# Patient Record
Sex: Male | Born: 1960 | Race: White | Hispanic: No | Marital: Single | Smoking: Current some day smoker
Health system: Southern US, Community
[De-identification: ages and names within clinical notes are randomized; demographics above are authoritative.]

## PROBLEM LIST (undated history)

## (undated) DIAGNOSIS — K5720 Diverticulitis of large intestine with perforation and abscess without bleeding: Principal | ICD-10-CM

## (undated) DIAGNOSIS — Z972 Presence of dental prosthetic device (complete) (partial): Secondary | ICD-10-CM

## (undated) DIAGNOSIS — M5412 Radiculopathy, cervical region: Secondary | ICD-10-CM

## (undated) HISTORY — DX: Diverticulitis of large intestine with perforation and abscess without bleeding: K57.20

## (undated) HISTORY — DX: Radiculopathy, cervical region: M54.12

---

## 1985-12-08 HISTORY — PX: HAND SURGERY: SHX662

## 2014-10-20 DIAGNOSIS — M5412 Radiculopathy, cervical region: Secondary | ICD-10-CM

## 2014-10-20 HISTORY — DX: Radiculopathy, cervical region: M54.12

## 2016-05-20 ENCOUNTER — Inpatient Hospital Stay
Admission: EM | Admit: 2016-05-20 | Discharge: 2016-06-01 | DRG: 330 | Disposition: A | Discharge status: Home Health | Payer: BLUE CROSS/BLUE SHIELD | Attending: Surgery | Admitting: Surgery

## 2016-05-20 ENCOUNTER — Encounter: Payer: Self-pay | Admitting: Emergency Medicine

## 2016-05-20 ENCOUNTER — Emergency Department: Payer: BLUE CROSS/BLUE SHIELD

## 2016-05-20 DIAGNOSIS — K567 Ileus, unspecified: Secondary | ICD-10-CM | POA: Diagnosis not present

## 2016-05-20 DIAGNOSIS — K5720 Diverticulitis of large intestine with perforation and abscess without bleeding: Secondary | ICD-10-CM

## 2016-05-20 DIAGNOSIS — Y92230 Patient room in hospital as the place of occurrence of the external cause: Secondary | ICD-10-CM | POA: Diagnosis not present

## 2016-05-20 DIAGNOSIS — K631 Perforation of intestine (nontraumatic): Secondary | ICD-10-CM

## 2016-05-20 DIAGNOSIS — Y838 Other surgical procedures as the cause of abnormal reaction of the patient, or of later complication, without mention of misadventure at the time of the procedure: Secondary | ICD-10-CM | POA: Diagnosis not present

## 2016-05-20 DIAGNOSIS — T8132XA Disruption of internal operation (surgical) wound, not elsewhere classified, initial encounter: Secondary | ICD-10-CM | POA: Diagnosis not present

## 2016-05-20 DIAGNOSIS — K660 Peritoneal adhesions (postprocedural) (postinfection): Secondary | ICD-10-CM | POA: Diagnosis present

## 2016-05-20 DIAGNOSIS — F17210 Nicotine dependence, cigarettes, uncomplicated: Secondary | ICD-10-CM | POA: Diagnosis present

## 2016-05-20 DIAGNOSIS — R52 Pain, unspecified: Secondary | ICD-10-CM

## 2016-05-20 DIAGNOSIS — R0602 Shortness of breath: Secondary | ICD-10-CM

## 2016-05-20 DIAGNOSIS — Z4659 Encounter for fitting and adjustment of other gastrointestinal appliance and device: Secondary | ICD-10-CM

## 2016-05-20 HISTORY — DX: Diverticulitis of large intestine with perforation and abscess without bleeding: K57.20

## 2016-05-20 LAB — CBC WITH DIFFERENTIAL/PLATELET
BASOS ABS: 0.1 10*3/uL (ref 0–0.1)
Eosinophils Absolute: 0 10*3/uL (ref 0–0.7)
Eosinophils Relative: 0 %
HEMATOCRIT: 45.1 % (ref 40.0–52.0)
HEMOGLOBIN: 15.8 g/dL (ref 13.0–18.0)
Lymphocytes Relative: 16 %
Lymphs Abs: 2 10*3/uL (ref 1.0–3.6)
MCH: 36.2 pg — ABNORMAL HIGH (ref 26.0–34.0)
MCHC: 35.2 g/dL (ref 32.0–36.0)
MCV: 102.9 fL — ABNORMAL HIGH (ref 80.0–100.0)
MONO ABS: 0.8 10*3/uL (ref 0.2–1.0)
Monocytes Relative: 6 %
NEUTROS ABS: 9.9 10*3/uL — AB (ref 1.4–6.5)
Platelets: 188 10*3/uL (ref 150–440)
RBC: 4.38 MIL/uL — ABNORMAL LOW (ref 4.40–5.90)
RDW: 14.3 % (ref 11.5–14.5)
WBC: 12.9 10*3/uL — ABNORMAL HIGH (ref 3.8–10.6)

## 2016-05-20 LAB — COMPREHENSIVE METABOLIC PANEL
ALBUMIN: 3.8 g/dL (ref 3.5–5.0)
ALT: 12 U/L — ABNORMAL LOW (ref 17–63)
AST: 18 U/L (ref 15–41)
Alkaline Phosphatase: 62 U/L (ref 38–126)
Anion gap: 11 (ref 5–15)
BILIRUBIN TOTAL: 2.2 mg/dL — AB (ref 0.3–1.2)
BUN: 11 mg/dL (ref 6–20)
CO2: 24 mmol/L (ref 22–32)
Calcium: 9.1 mg/dL (ref 8.9–10.3)
Chloride: 98 mmol/L — ABNORMAL LOW (ref 101–111)
Creatinine, Ser: 1.01 mg/dL (ref 0.61–1.24)
GFR calc Af Amer: >60 mL/min (ref >60)
GFR calc non Af Amer: >60 mL/min (ref >60)
GLUCOSE: 130 mg/dL — AB (ref 65–99)
POTASSIUM: 3.6 mmol/L (ref 3.5–5.1)
SODIUM: 133 mmol/L — AB (ref 135–145)
TOTAL PROTEIN: 7.5 g/dL (ref 6.5–8.1)

## 2016-05-20 LAB — URINALYSIS COMPLETE WITH MICROSCOPIC (ARMC ONLY)
BACTERIA UA: NONE SEEN
Bilirubin Urine: NEGATIVE
Glucose, UA: NEGATIVE mg/dL
HGB URINE DIPSTICK: NEGATIVE
LEUKOCYTES UA: NEGATIVE
Nitrite: NEGATIVE
PH: 5 (ref 5.0–8.0)
PROTEIN: NEGATIVE mg/dL
SPECIFIC GRAVITY, URINE: 1.043 — AB (ref 1.005–1.030)
SQUAMOUS EPITHELIAL / LPF: NONE SEEN

## 2016-05-20 LAB — TROPONIN I: Troponin I: <0.03 ng/mL (ref <0.031)

## 2016-05-20 LAB — LIPASE, BLOOD: Lipase: 16 U/L (ref 11–51)

## 2016-05-20 MED ORDER — HYDROMORPHONE HCL 1 MG/ML IJ SOLN: 0.5000 mg | INTRAMUSCULAR | Status: DC | PRN

## 2016-05-20 MED ORDER — LACTATED RINGERS IV BOLUS (SEPSIS)
1000.0000 mL | Freq: Once | INTRAVENOUS | Status: AC
  Administered 2016-05-20: 1000 mL via INTRAVENOUS
  Filled 2016-05-20: qty 1000

## 2016-05-20 MED ORDER — PIPERACILLIN-TAZOBACTAM 3.375 G IVPB 30 MIN
3.3750 g | Freq: Once | INTRAVENOUS | Status: AC
  Administered 2016-05-20: 3.375 g via INTRAVENOUS
  Filled 2016-05-20: qty 50

## 2016-05-20 MED ORDER — DIPHENHYDRAMINE HCL 12.5 MG/5ML PO ELIX: 12.5000 mg | ORAL_SOLUTION | Freq: Four times a day (QID) | ORAL | Status: DC | PRN

## 2016-05-20 MED ORDER — ENOXAPARIN SODIUM 40 MG/0.4ML ~~LOC~~ SOLN
40.0000 mg | SUBCUTANEOUS | Status: DC
  Administered 2016-05-21 – 2016-06-01 (×11): 40 mg via SUBCUTANEOUS
  Filled 2016-05-20 (×11): qty 0.4

## 2016-05-20 MED ORDER — ONDANSETRON 8 MG PO TBDP: 4.0000 mg | ORAL_TABLET | Freq: Four times a day (QID) | ORAL | Status: DC | PRN

## 2016-05-20 MED ORDER — MORPHINE SULFATE (PF) 4 MG/ML IV SOLN
4.0000 mg | Freq: Once | INTRAVENOUS | Status: AC
  Administered 2016-05-20: 4 mg via INTRAVENOUS

## 2016-05-20 MED ORDER — SODIUM CHLORIDE 0.9 % IV SOLN
1000.0000 mL | Freq: Once | INTRAVENOUS | Status: AC
  Administered 2016-05-20: 1000 mL via INTRAVENOUS

## 2016-05-20 MED ORDER — DIPHENHYDRAMINE HCL 50 MG/ML IJ SOLN
12.5000 mg | Freq: Four times a day (QID) | INTRAMUSCULAR | Status: DC | PRN
  Administered 2016-05-20: 12.5 mg via INTRAVENOUS
  Filled 2016-05-20: qty 1

## 2016-05-20 MED ORDER — MORPHINE SULFATE (PF) 4 MG/ML IV SOLN
INTRAVENOUS | Status: AC
  Administered 2016-05-20: 4 mg via INTRAVENOUS
  Filled 2016-05-20: qty 1

## 2016-05-20 MED ORDER — DIATRIZOATE MEGLUMINE & SODIUM 66-10 % PO SOLN
15.0000 mL | Freq: Once | ORAL | Status: AC
  Administered 2016-05-20: 15 mL via ORAL

## 2016-05-20 MED ORDER — NICOTINE 14 MG/24HR TD PT24
14.0000 mg | MEDICATED_PATCH | Freq: Every day | TRANSDERMAL | Status: DC
  Administered 2016-05-20 – 2016-06-01 (×13): 14 mg via TRANSDERMAL
  Filled 2016-05-20 (×13): qty 1

## 2016-05-20 MED ORDER — ONDANSETRON HCL 4 MG/2ML IJ SOLN
4.0000 mg | Freq: Once | INTRAMUSCULAR | Status: AC
  Administered 2016-05-20: 4 mg via INTRAVENOUS
  Filled 2016-05-20: qty 2

## 2016-05-20 MED ORDER — PIPERACILLIN-TAZOBACTAM 3.375 G IVPB
3.3750 g | Freq: Three times a day (TID) | INTRAVENOUS | Status: DC
  Administered 2016-05-20 – 2016-06-01 (×35): 3.375 g via INTRAVENOUS
  Filled 2016-05-20 (×38): qty 50

## 2016-05-20 MED ORDER — KETOROLAC TROMETHAMINE 30 MG/ML IJ SOLN
30.0000 mg | Freq: Four times a day (QID) | INTRAMUSCULAR | Status: DC | PRN
  Administered 2016-05-20 – 2016-05-21 (×4): 30 mg via INTRAVENOUS
  Filled 2016-05-20 (×5): qty 1

## 2016-05-20 MED ORDER — LACTATED RINGERS IV BOLUS (SEPSIS)
1000.0000 mL | Freq: Once | INTRAVENOUS | Status: AC
  Administered 2016-05-20: 1000 mL via INTRAVENOUS

## 2016-05-20 MED ORDER — IOPAMIDOL (ISOVUE-300) INJECTION 61%
100.0000 mL | Freq: Once | INTRAVENOUS | Status: AC | PRN
  Administered 2016-05-20: 100 mL via INTRAVENOUS

## 2016-05-20 MED ORDER — DEXTROSE-NACL 5-0.9 % IV SOLN
INTRAVENOUS | Status: DC
  Administered 2016-05-20 – 2016-05-22 (×6): via INTRAVENOUS
  Administered 2016-05-22: 1000 mL via INTRAVENOUS
  Administered 2016-05-23 – 2016-05-26 (×10): via INTRAVENOUS
  Administered 2016-05-27: 1000 mL via INTRAVENOUS
  Administered 2016-05-28 – 2016-05-31 (×4): via INTRAVENOUS

## 2016-05-20 MED ORDER — OXYCODONE-ACETAMINOPHEN 5-325 MG PO TABS
1.0000 | ORAL_TABLET | ORAL | Status: DC | PRN
  Administered 2016-05-20 – 2016-05-22 (×5): 2 via ORAL
  Filled 2016-05-20 (×5): qty 2

## 2016-05-20 MED ORDER — MORPHINE SULFATE (PF) 4 MG/ML IV SOLN
4.0000 mg | Freq: Once | INTRAVENOUS | Status: AC
  Administered 2016-05-20: 4 mg via INTRAVENOUS
  Filled 2016-05-20: qty 1

## 2016-05-20 MED ORDER — ONDANSETRON HCL 4 MG/2ML IJ SOLN
4.0000 mg | Freq: Four times a day (QID) | INTRAMUSCULAR | Status: DC | PRN
  Administered 2016-05-20 – 2016-05-24 (×3): 4 mg via INTRAVENOUS
  Filled 2016-05-20 (×4): qty 2

## 2016-05-20 NOTE — ED Notes (Signed)
Approximately 5 minutes after morphine administration, pt's HR dropped to 43 and this nurse noticed that pt was looking off to the right. Pt did not move or respond to nurse's repeated stimuli, including voice, touch, gentle shaking. Dr. Corky Downs called to bedside. At this time, pt's limbs were rigid, and he still did not respond. It was thought pt might be seizing. After approximately 1.5 -2 minutes, pt became responsive, with no typical post-ictal response. Pt stated that he had begun to feel "a little dizzy" just after morphine administered, but had no memory of the moments that followed. HR returned to 90's, and pt alert & oriented at this time. Pt requesting ice chips/water, but informed that he can't have any at this time.

## 2016-05-20 NOTE — H&P (Signed)
Patient ID: Clayton Butler, male   DOB: October 22, 1961, 55 y.o.   MRN: [id number]  History of Present Illness Clayton Butler is a 55 y.o. male with a history of abdominal pain. Patient reports that his pain is in the left lower quadrant and suprapubic area. Pain is sharp intermittent moderate to severe in intensity. He also describes some chills and some nausea. Normal bowel movements. Pain worsens when he moves around. He has never had a colonoscopy before. No history of colorectal cancer. Initially in the emergency room he was given a couple doses of morphine and developed vasovagal symptoms with his heart rate in the 40s and decreased blood pressure shortly after about mid have responded and since then he has been fine. CT scan is personally reviewed, there is evidence of sigmoid diverticulitis with small foci of free air and with contained perforation. There is a small fluid collection measuring 3 cm. No evidence of frank free air.  Past Medical History History reviewed. No pertinent past medical history.    History reviewed. No pertinent past surgical history.  No Known Allergies  Current Facility-Administered Medications  Medication Dose Route Frequency Provider Last Rate Last Dose  . dextrose 5 %-0.9 % sodium chloride infusion   Intravenous Continuous Diego F Pabon, MD      . diphenhydrAMINE (BENADRYL) 12.5 MG/5ML elixir 12.5 mg  12.5 mg Oral Q6H PRN Diego F Pabon, MD       Or  . diphenhydrAMINE (BENADRYL) injection 12.5 mg  12.5 mg Intravenous Q6H PRN Diego F Pabon, MD      . enoxaparin (LOVENOX) injection 40 mg  40 mg Subcutaneous Q24H Diego F Pabon, MD      . HYDROmorphone (DILAUDID) injection 0.5 mg  0.5 mg Intravenous Q2H PRN Diego F Pabon, MD      . ketorolac (TORADOL) 30 MG/ML injection 30 mg  30 mg Intravenous Q6H PRN Diego F Pabon, MD      . lactated ringers bolus 1,000 mL  1,000 mL Intravenous Once Diego F Pabon, MD      . nicotine (NICODERM CQ - dosed in mg/24 hours) patch 14  mg  14 mg Transdermal Daily Diego F Pabon, MD      . ondansetron (ZOFRAN-ODT) disintegrating tablet 4 mg  4 mg Oral Q6H PRN Diego F Pabon, MD       Or  . ondansetron (ZOFRAN) injection 4 mg  4 mg Intravenous Q6H PRN Diego F Pabon, MD      . piperacillin-tazobactam (ZOSYN) IVPB 3.375 g  3.375 g Intravenous Q8H Diego Sarita Haver, MD       No current outpatient prescriptions on file.    Family History History reviewed. No pertinent family history.     Social History Social History  Substance Use Topics  . Smoking status: Current Every Day Smoker -- 1.50 packs/day    Types: Cigarettes  . Smokeless tobacco: None  . Alcohol Use: 4.2 oz/week    7 Cans of beer per week      ROS 10 pt ROS performed and is otherwise negative  Physical Exam Blood pressure 114/78, pulse 102, temperature 97.7 F (36.5 C), temperature source Oral, resp. rate 18, height 6\' 2"  (1.88 m), weight 95.255 kg (210 lb), SpO2 96 %.  CONSTITUTIONAL: NAD, non toxic EYES: Pupils equal, round, and reactive to light, Sclera non-icteric. EARS, NOSE, MOUTH AND THROAT: The oropharynx is clear. Oral mucosa is pink and moist. Hearing is intact to voice.  NECK: Trachea is midline, and  there is no jugular venous distension. Thyroid is without palpable abnormalities. LYMPH NODES:  Lymph nodes in the neck are not enlarged. RESPIRATORY:  Lungs are clear, and breath sounds are equal bilaterally. Normal respiratory effort without pathologic use of accessory muscles. CARDIOVASCULAR: Heart is regular without murmurs, gallops, or rubs. GI: The abdomen is soft,Tender suprapubic area and LLQ, focal peritonitis, some rebound but not fran peritonitis. MUSCULOSKELETAL:  Normal muscle strength and tone in all four extremities.    SKIN: Skin turgor is normal. There are no pathologic skin lesions.  NEUROLOGIC:  Motor and sensation is grossly normal.  Cranial nerves are grossly intact. PSYCH:  Alert and oriented to person, place and time. Affect  is normal.  Data Reviewed  I have personally reviewed the patient's imaging and medical records.    Assessment/Plan Complicated diverticulitis with microperforation. At this point patient is nontoxic and there is no frank peritoneal signs. Discussed with the patient in detail about the options of IV antibiotics, crystalloid resuscitation versus going directly to the operating room for a Hartman's procedure. I do think that is reasonable to observe him 24 hours but if he deteriorates he will need a laparotomy and Hartman's. Discussed with the patient in detail and he understands. He wishes to avoid colostomy final possible but he also understands that if this does not improve he will need a Hartman's. Extensive counseling provided. No need for emergent surgical revision at this time Caroleen Hamman, MD [place] 05/20/2016, 1:20 PM

## 2016-05-20 NOTE — ED Notes (Signed)
Surgeon at bedside.  

## 2016-05-20 NOTE — ED Provider Notes (Signed)
Patient had near syncopal event after morphine administration. Approximately 3 minutes after morphine 4 mg he began to feel very hot and dizzy and briefly became unresponsive. He tolerated the initial dose of morphine well however. We will cont. IV fluids and list morphine as a possible allergy  Lavonia Drafts, MD 05/20/16 1017

## 2016-05-20 NOTE — ED Notes (Signed)
Pt via ems from home with lower abdominal pain x 2 days. States he has increased frequency, darker urine with stronger smell. Pt states that the pain was extremely sharp this morning, causing him to have to lie down.

## 2016-05-20 NOTE — ED Provider Notes (Signed)
[facility] Emergency Department Provider Note  ____________________________________________    I have reviewed the triage vital signs and the nursing notes.   HISTORY  Chief Complaint Abdominal Pain    HPI Ghaith Kata is a 55 y.o. male who presents with complaints of lower abdominal pain. Patient reports the pain started 2 days ago and has become progressively worse. He reports it is primarily in the suprapubic and left lower quadrant area. He is never had this before. He denies hematuria. He does have pain with urinating. He denies fevers chills. He does report mild nausea. No history of abdominal surgeries. No history of kidney stones. He describes the pain as moderate to severe and cramping     History reviewed. No pertinent past medical history.  There are no active problems to display for this patient.   History reviewed. No pertinent past surgical history.  No current outpatient prescriptions on file.  Allergies Review of patient's allergies indicates no known allergies.  History reviewed. No pertinent family history.  Social History Social History  Substance Use Topics  . Smoking status: Current Every Day Smoker -- 1.50 packs/day    Types: Cigarettes  . Smokeless tobacco: None  . Alcohol Use: 4.2 oz/week    7 Cans of beer per week    Review of Systems  Constitutional: Negative for fever. Eyes: Negative for redness ENT: Negative for sore throat Cardiovascular: Negative for chest pain Respiratory: Negative for Cough Gastrointestinal: As above Genitourinary: As above Musculoskeletal: Negative for back pain. Skin: Negative for rash. Neurological: Negative for focal weakness Psychiatric: no anxiety    ____________________________________________   PHYSICAL EXAM:  VITAL SIGNS: ED Triage Vitals  Enc Vitals Group     BP 05/20/16 0804 130/76 mmHg     Pulse Rate 05/20/16 0804 109     Resp 05/20/16 0804 13     Temp  05/20/16 0804 97.7 F (36.5 C)     Temp Source 05/20/16 0804 Oral     SpO2 05/20/16 0804 97 %     Weight 05/20/16 0804 210 lb (95.255 kg)     Height 05/20/16 0804 6\' 2"  (1.88 m)     Head Cir --      Peak Flow --      Pain Score 05/20/16 0805 9     Pain Loc --      Pain Edu? --      Excl. in [place]? --      Constitutional: Alert and oriented. Uncomfortable appearing in no acute distress Eyes: Conjunctivae are normal. No erythema or injection ENT   Head: Normocephalic and atraumatic.   Mouth/Throat: Mucous membranes are moist. Cardiovascular: Tachycardia, regular rhythm. Normal and symmetric distal pulses are present in the upper extremities.   Respiratory: Normal respiratory effort without tachypnea nor retractions. Breath sounds are clear and equal bilaterally.  Gastrointestinal: Moderate tenderness to palpation left lower quadrant and suprapubicly. Mild distention. There is no CVA tenderness. Genitourinary: deferred Musculoskeletal: Nontender with normal range of motion in all extremities. No lower extremity tenderness nor edema. Neurologic:  Normal speech and language. No gross focal neurologic deficits are appreciated. Skin:  Skin is warm and intact. Mild diaphoresis. No rash noted. Psychiatric: Mood and affect are normal. Patient exhibits appropriate insight and judgment.  ____________________________________________    LABS (pertinent positives/negatives)  Labs Reviewed  CBC WITH DIFFERENTIAL/PLATELET  COMPREHENSIVE METABOLIC PANEL  TROPONIN I  URINALYSIS COMPLETEWITH MICROSCOPIC ([facility] ONLY)  LIPASE, BLOOD    ____________________________________________   EKG  ED  ECG REPORT I, Lavonia Drafts, the attending physician, personally viewed and interpreted this ECG.  Date: 05/20/2016 EKG Time: 8:14 AM Rate: 81 Rhythm: normal sinus rhythm QRS Axis: normal Intervals: normal ST/T Wave abnormalities: normal Conduction Disturbances: none Narrative  Interpretation: unremarkable   ____________________________________________    RADIOLOGY  CT scan pending  ____________________________________________   PROCEDURES  Procedure(s) performed: none  Critical Care performed: yes  CRITICAL CARE Performed by: Lavonia Drafts   Total critical care time: 35 minutes  Critical care time was exclusive of separately billable procedures and treating other patients.  Critical care was necessary to treat or prevent imminent or life-threatening deterioration.  Critical care was time spent personally by me on the following activities: development of treatment plan with patient and/or surrogate as well as nursing, discussions with consultants, evaluation of patient's response to treatment, examination of patient, obtaining history from patient or surrogate, ordering and performing treatments and interventions, ordering and review of laboratory studies, ordering and review of radiographic studies, pulse oximetry and re-evaluation of patient's condition.   ____________________________________________   INITIAL IMPRESSION / ASSESSMENT AND PLAN / ED COURSE  Pertinent labs & imaging results that were available during my care of the patient were reviewed by me and considered in my medical decision making (see chart for details).  Patient resents with lower abdominal pain with significant tenderness palpation on exam. He is tachycardic and mildly diaphoretic. Differential includes ureterolithiasis, diverticulitis, appendicitis, UTI. IV fluids, morphine and Zofran ordered and CT scan pending  ----------------------------------------- 9:56 AM on 05/20/2016 -----------------------------------------  Called by radiology and notified of perforated sigmoid diverticulitis. I called Dr. Dahlia Byes who is in surgery at the moment he will come see the patient in between cases. Zosyn and blood cultures ordered. Blood pressure  stable  ____________________________________________   FINAL CLINICAL IMPRESSION(S) / ED DIAGNOSES  Final diagnoses:  Perforated sigmoid colon ([place])          Lavonia Drafts, MD 05/20/16 [phone]

## 2016-05-20 NOTE — ED Notes (Signed)
When attempting to provide urine sample, pt began to feel weak and stated he felt as if he was about to pass out. Pt assisted back to stretcher.

## 2016-05-21 LAB — APTT: APTT: 35 s (ref 24–36)

## 2016-05-21 LAB — BASIC METABOLIC PANEL
ANION GAP: 7 (ref 5–15)
BUN: 17 mg/dL (ref 6–20)
CO2: 23 mmol/L (ref 22–32)
Calcium: 8.3 mg/dL — ABNORMAL LOW (ref 8.9–10.3)
Chloride: 107 mmol/L (ref 101–111)
Creatinine, Ser: 1.05 mg/dL (ref 0.61–1.24)
GLUCOSE: 119 mg/dL — AB (ref 65–99)
POTASSIUM: 2.8 mmol/L — AB (ref 3.5–5.1)
Sodium: 137 mmol/L (ref 135–145)

## 2016-05-21 LAB — PROTIME-INR
INR: 1.21
PROTHROMBIN TIME: 15.5 s — AB (ref 11.4–15.0)

## 2016-05-21 LAB — CBC
HEMATOCRIT: 40 % (ref 40.0–52.0)
Hemoglobin: 14.1 g/dL (ref 13.0–18.0)
MCH: 36.5 pg — ABNORMAL HIGH (ref 26.0–34.0)
MCHC: 35.2 g/dL (ref 32.0–36.0)
MCV: 103.8 fL — ABNORMAL HIGH (ref 80.0–100.0)
PLATELETS: 148 10*3/uL — AB (ref 150–440)
RBC: 3.85 MIL/uL — ABNORMAL LOW (ref 4.40–5.90)
RDW: 14 % (ref 11.5–14.5)
WBC: 8.1 10*3/uL (ref 3.8–10.6)

## 2016-05-21 MED ORDER — FAMOTIDINE IN NACL 20-0.9 MG/50ML-% IV SOLN
20.0000 mg | Freq: Two times a day (BID) | INTRAVENOUS | Status: DC
  Administered 2016-05-21 – 2016-06-01 (×23): 20 mg via INTRAVENOUS
  Filled 2016-05-21 (×25): qty 50

## 2016-05-21 MED ORDER — POTASSIUM CHLORIDE 10 MEQ/100ML IV SOLN
10.0000 meq | INTRAVENOUS | Status: AC
  Administered 2016-05-21 (×2): 10 meq via INTRAVENOUS
  Filled 2016-05-21 (×2): qty 100

## 2016-05-21 NOTE — Progress Notes (Signed)
CC: Diverticulitis Subjective: Feeling better, some moderate intermittent pain, there is improvement as compared to yesterday. WBC coming down, low grade temp yesterday, No N/V   Objective: Vital signs in last 24 hours: Temp:  [97.7 F (36.5 C)-100.9 F (38.3 C)] 97.7 F (36.5 C) (06/14 1230) Pulse Rate:  [97-139] 98 (06/14 1230) Resp:  [17-28] 18 (06/14 1230) BP: (91-131)/(61-91) 109/72 mmHg (06/14 1230) SpO2:  [94 %-100 %] 96 % (06/14 1230) Last BM Date: 05/19/16  Intake/Output from previous day: 06/13 0701 - 06/14 0700 In: 0  Out: 750 [Urine:750] Intake/Output this shift: Total I/O In: 3894 [I.V.:3651; IV Piggyback:243] Out: 250 [Urine:250]  Physical exam: NAD awake alert Chest: cta, NSR Abd: soft, TTP LLQ, no peritonitis Ext well perfused, no edema  Lab Results: CBC   Recent Labs  05/20/16 0820 05/21/16 0409  WBC 12.9* 8.1  HGB 15.8 14.1  HCT 45.1 40.0  PLT 188 148*   BMET  Recent Labs  05/20/16 0820 05/21/16 0409  NA 133* 137  K 3.6 2.8*  CL 98* 107  CO2 24 23  GLUCOSE 130* 119*  BUN 11 17  CREATININE 1.01 1.05  CALCIUM 9.1 8.3*   PT/INR  Recent Labs  05/21/16 0409  LABPROT 15.5*  INR 1.21   ABG No results for input(s): PHART, HCO3 in the last 72 hours.  Invalid input(s): PCO2, PO2  Studies/Results: Ct Abdomen Pelvis W Contrast  05/20/2016  CLINICAL DATA:  Lower abdominal pain, diarrhea for 2 days, elevated WBC EXAM: CT ABDOMEN AND PELVIS WITH CONTRAST TECHNIQUE: Multidetector CT imaging of the abdomen and pelvis was performed using the standard protocol following bolus administration of intravenous contrast. CONTRAST:  181mL ISOVUE-300 IOPAMIDOL (ISOVUE-300) INJECTION 61% COMPARISON:  None. FINDINGS: Lower chest:  The lung bases are unremarkable. Hepatobiliary: At least 3 hepatic cysts are noted the largest in right hepatic lobe centrally measures 2.7 cm. No intrahepatic biliary ductal dilatation. No calcified gallstones are noted within  gallbladder. Pancreas: Enhanced pancreas is unremarkable. Spleen: Enhanced spleen is unremarkable. Adrenals/Urinary Tract: There is a low-density nodule probable adenoma right adrenal gland measures 2 cm. Minimal thickening of left adrenal gland without evidence of mass. Kidneys are symmetrical in size and enhancement. No hydronephrosis or hydroureter. Delayed renal images shows bilateral renal symmetrical excretion. The urinary bladder is unremarkable. Stomach/Bowel: There is no gastric outlet obstruction. No small bowel obstruction. No pericecal inflammation. The cecum is empty collapsed. The appendix is not identified. Minimal stranding of lower retroperitoneal fat. Scattered diverticula are noted sigmoid colon. Axial image 74 there is segmental thickening of mid sigmoid colon wall. There is significant stranding of surrounding fat. Axial image 70 there is small extraluminal mesenteric air in right posterior pelvis. Tiny foci of extraluminal air noted just lateral to mid sigmoid colon axial image 74. Axial image 79 there is air collection just inferior to the sigmoid colon measures at least 3.3 cm. Findings are consistent with perforated colitis or diverticulitis. There is no evidence of pericolonic abscess. The sigmoid colon is empty partially collapsed. Vascular/Lymphatic: No aortic aneurysm. Mild atherosclerotic calcifications of distal abdominal aorta and iliac arteries. Small nonspecific retroperitoneal lymph nodes are noted probable reactive the largest in axial image 50 measures 7 mm. Reproductive: Prostate gland and seminal vesicles are unremarkable. Prostate gland calcifications are noted. Small nonspecific bilateral inguinal lymph nodes are noted. No adenopathy. Other: No abdominal or pelvic ascites. Musculoskeletal: No destructive bony lesions are noted. Sagittal images of the spine shows mild degenerative changes lower thoracic and lumbar  spine. No destructive bony lesions are noted within pelvis.  Probable bone island left greater femoral trochanter measures 7 mm. IMPRESSION: 1. There is segmental thickening of mid sigmoid colon wall axial image 74 and 75. This segment measures at least 7.5 cm in length. Colonic diverticula are noted at this level. Multiple foci of extraluminal air are noted. There is a air collection just inferior to sigmoid colon at this level measures 3.3 cm. Findings are consistent with perforated colitis or diverticulitis. No evidence of pericolonic abscess or mesenteric fluid collection. 2. No pericecal inflammation.  Appendix is not identified. 3. No small bowel obstruction. 4. No hydronephrosis or hydroureter. 5. Scattered hepatic cysts are noted the largest measures 2.7 cm 6. Mild degenerative changes thoracolumbar spine. These results were called by telephone at the time of interpretation on 05/20/2016 at 9:42 am to Dr. Lavonia Drafts , who verbally acknowledged these results. Electronically Signed   By: Lahoma Crocker M.D.   On: 05/20/2016 09:43    Anti-infectives: Anti-infectives    Start     Dose/Rate Route Frequency Ordered Stop   05/20/16 1330  piperacillin-tazobactam (ZOSYN) IVPB 3.375 g     3.375 g 12.5 mL/hr over 240 Minutes Intravenous Every 8 hours 05/20/16 1319     05/20/16 0945  piperacillin-tazobactam (ZOSYN) IVPB 3.375 g     3.375 g 100 mL/hr over 30 Minutes Intravenous  Once 05/20/16 [id number] 05/20/16 1137      Assessment/Plan: Contained perf diverticulitis responding to medical rx for now and no need for emergent surgical rx. We will keep NPO for now and he still has some tenderness. IF he continues to improve may not required surgical intervention, obviously if he deteriorates may repeat CT vs Hartman's. D/w pt in detail. Continue ABs and crystalloids.  Caroleen Hamman, MD, [facility]  05/21/2016

## 2016-05-21 NOTE — Progress Notes (Signed)
Critical lab value reported of potassium of 2.8. Reported to Dr. Burt Knack.

## 2016-05-21 NOTE — Progress Notes (Signed)
Pt had IV Zosyn hung per scheduled order. Within a few minutes pt begin shaking and reporting increased pain in his colon. Pt began having a very strong cough. Vital signs were taken and pt was found to have an elevated temp. Zosyn was stopped and Dr. Burt Knack paged. Dr. Burt Knack ordered for a bolus of LR, to reconnect to the zosyn, and ordered percocet for both pain and the temperature.

## 2016-05-21 NOTE — Progress Notes (Signed)
Okay to place order for NPO except sips with meds. Also per Dr. Dahlia Byes place order for IV famotidine 20mg  1v q 12hours as pt stated he is having indegestion

## 2016-05-22 ENCOUNTER — Inpatient Hospital Stay: Payer: BLUE CROSS/BLUE SHIELD | Admitting: Certified Registered Nurse Anesthetist

## 2016-05-22 ENCOUNTER — Encounter
Admission: EM | Disposition: A | Discharge status: Home Health | Payer: Self-pay | Source: Home / Self Care | Attending: Surgery

## 2016-05-22 ENCOUNTER — Encounter: Payer: Self-pay | Admitting: Anesthesiology

## 2016-05-22 HISTORY — PX: LAPAROTOMY: SHX154

## 2016-05-22 HISTORY — PX: COLECTOMY WITH COLOSTOMY CREATION/HARTMANN PROCEDURE: SHX6598

## 2016-05-22 LAB — CBC
HEMATOCRIT: 39.8 % — AB (ref 40.0–52.0)
HEMOGLOBIN: 13.9 g/dL (ref 13.0–18.0)
MCH: 36 pg — AB (ref 26.0–34.0)
MCHC: 34.8 g/dL (ref 32.0–36.0)
MCV: 103.3 fL — AB (ref 80.0–100.0)
Platelets: 142 10*3/uL — ABNORMAL LOW (ref 150–440)
RBC: 3.85 MIL/uL — ABNORMAL LOW (ref 4.40–5.90)
RDW: 14.1 % (ref 11.5–14.5)
WBC: 10.6 10*3/uL (ref 3.8–10.6)

## 2016-05-22 LAB — BASIC METABOLIC PANEL
Anion gap: 8 (ref 5–15)
BUN: 22 mg/dL — AB (ref 6–20)
CHLORIDE: 108 mmol/L (ref 101–111)
CO2: 21 mmol/L — AB (ref 22–32)
CREATININE: 0.91 mg/dL (ref 0.61–1.24)
Calcium: 8.3 mg/dL — ABNORMAL LOW (ref 8.9–10.3)
GFR calc Af Amer: >60 mL/min (ref >60)
GFR calc non Af Amer: >60 mL/min (ref >60)
GLUCOSE: 154 mg/dL — AB (ref 65–99)
Potassium: 3.6 mmol/L (ref 3.5–5.1)
Sodium: 137 mmol/L (ref 135–145)

## 2016-05-22 LAB — GLUCOSE, CAPILLARY: Glucose-Capillary: 164 mg/dL — ABNORMAL HIGH (ref 65–99)

## 2016-05-22 SURGERY — LAPAROTOMY, EXPLORATORY
Anesthesia: General | Wound class: Clean Contaminated

## 2016-05-22 MED ORDER — HYDROMORPHONE 1 MG/ML IV SOLN
INTRAVENOUS | Status: DC
  Administered 2016-05-22: 1.9 mg via INTRAVENOUS
  Administered 2016-05-22: 20:00:00 via INTRAVENOUS
  Administered 2016-05-23 (×2): 0.6 mg via INTRAVENOUS
  Administered 2016-05-23: 0 mg via INTRAVENOUS
  Administered 2016-05-23: 0.6 mg via INTRAVENOUS
  Administered 2016-05-23: 3.5 mg via INTRAVENOUS
  Administered 2016-05-23: 0.8 mg via INTRAVENOUS
  Administered 2016-05-24: 0 mg via INTRAVENOUS
  Filled 2016-05-22 (×2): qty 25

## 2016-05-22 MED ORDER — MENTHOL 3 MG MT LOZG
1.0000 | LOZENGE | OROMUCOSAL | Status: DC | PRN
  Filled 2016-05-22: qty 9

## 2016-05-22 MED ORDER — FENTANYL CITRATE (PF) 100 MCG/2ML IJ SOLN
25.0000 ug | INTRAMUSCULAR | Status: DC | PRN
  Administered 2016-05-22 (×2): 50 ug via INTRAVENOUS

## 2016-05-22 MED ORDER — SODIUM CHLORIDE 0.9% FLUSH: 9.0000 mL | INTRAVENOUS | Status: DC | PRN

## 2016-05-22 MED ORDER — DEXAMETHASONE SODIUM PHOSPHATE 10 MG/ML IJ SOLN
INTRAMUSCULAR | Status: DC | PRN
  Administered 2016-05-22: 10 mg via INTRAVENOUS

## 2016-05-22 MED ORDER — PROPOFOL 10 MG/ML IV BOLUS
INTRAVENOUS | Status: DC | PRN
  Administered 2016-05-22: 200 mg via INTRAVENOUS

## 2016-05-22 MED ORDER — LACTATED RINGERS IV BOLUS (SEPSIS)
1000.0000 mL | Freq: Once | INTRAVENOUS | Status: AC
  Administered 2016-05-22: 1000 mL via INTRAVENOUS

## 2016-05-22 MED ORDER — ONDANSETRON HCL 4 MG/2ML IJ SOLN
INTRAMUSCULAR | Status: DC | PRN
  Administered 2016-05-22: 4 mg via INTRAVENOUS

## 2016-05-22 MED ORDER — DIPHENHYDRAMINE HCL 12.5 MG/5ML PO ELIX: 12.5000 mg | ORAL_SOLUTION | Freq: Four times a day (QID) | ORAL | Status: DC | PRN

## 2016-05-22 MED ORDER — DIPHENHYDRAMINE HCL 50 MG/ML IJ SOLN: 12.5000 mg | Freq: Four times a day (QID) | INTRAMUSCULAR | Status: DC | PRN

## 2016-05-22 MED ORDER — SODIUM CHLORIDE 0.9 % IV SOLN
INTRAVENOUS | Status: DC | PRN
  Administered 2016-05-22: 70 mL

## 2016-05-22 MED ORDER — MIDAZOLAM HCL 2 MG/2ML IJ SOLN
INTRAMUSCULAR | Status: DC | PRN
  Administered 2016-05-22: 2 mg via INTRAVENOUS

## 2016-05-22 MED ORDER — ACETAMINOPHEN 10 MG/ML IV SOLN
INTRAVENOUS | Status: DC | PRN
  Administered 2016-05-22: 1000 mg via INTRAVENOUS

## 2016-05-22 MED ORDER — KETOROLAC TROMETHAMINE 30 MG/ML IJ SOLN
30.0000 mg | Freq: Four times a day (QID) | INTRAMUSCULAR | Status: AC
  Administered 2016-05-22 – 2016-05-25 (×12): 30 mg via INTRAVENOUS
  Filled 2016-05-22 (×12): qty 1

## 2016-05-22 MED ORDER — LIDOCAINE HCL (CARDIAC) 20 MG/ML IV SOLN
INTRAVENOUS | Status: DC | PRN
  Administered 2016-05-22: 50 mg via INTRAVENOUS

## 2016-05-22 MED ORDER — BUPIVACAINE HCL 0.25 % IJ SOLN
INTRAMUSCULAR | Status: DC | PRN
  Administered 2016-05-22: 30 mL

## 2016-05-22 MED ORDER — SUCCINYLCHOLINE CHLORIDE 20 MG/ML IJ SOLN
INTRAMUSCULAR | Status: DC | PRN
  Administered 2016-05-22: 140 mg via INTRAVENOUS

## 2016-05-22 MED ORDER — SUGAMMADEX SODIUM 200 MG/2ML IV SOLN
INTRAVENOUS | Status: DC | PRN
  Administered 2016-05-22: 200 mg via INTRAVENOUS

## 2016-05-22 MED ORDER — FENTANYL CITRATE (PF) 100 MCG/2ML IJ SOLN
INTRAMUSCULAR | Status: AC
  Filled 2016-05-22: qty 2

## 2016-05-22 MED ORDER — NALOXONE HCL 0.4 MG/ML IJ SOLN: 0.4000 mg | INTRAMUSCULAR | Status: DC | PRN

## 2016-05-22 MED ORDER — CHLORHEXIDINE GLUCONATE 4 % EX LIQD: 1.0000 "application " | Freq: Once | CUTANEOUS | Status: DC

## 2016-05-22 MED ORDER — ROCURONIUM BROMIDE 100 MG/10ML IV SOLN
INTRAVENOUS | Status: DC | PRN
  Administered 2016-05-22: 30 mg via INTRAVENOUS
  Administered 2016-05-22: 20 mg via INTRAVENOUS

## 2016-05-22 MED ORDER — HYDROMORPHONE HCL 1 MG/ML IJ SOLN
INTRAMUSCULAR | Status: DC | PRN
  Administered 2016-05-22: 1 mg via INTRAVENOUS

## 2016-05-22 MED ORDER — ALBUMIN HUMAN 5 % IV SOLN
INTRAVENOUS | Status: AC
  Administered 2016-05-22: 15:00:00 via INTRAVENOUS
  Filled 2016-05-22: qty 250

## 2016-05-22 MED ORDER — FENTANYL CITRATE (PF) 100 MCG/2ML IJ SOLN
INTRAMUSCULAR | Status: DC | PRN
  Administered 2016-05-22: 150 ug via INTRAVENOUS

## 2016-05-22 MED ORDER — ONDANSETRON HCL 4 MG/2ML IJ SOLN: 4.0000 mg | Freq: Four times a day (QID) | INTRAMUSCULAR | Status: DC | PRN

## 2016-05-22 MED ORDER — KETOROLAC TROMETHAMINE 30 MG/ML IJ SOLN
INTRAMUSCULAR | Status: DC | PRN
  Administered 2016-05-22: 30 mg via INTRAVENOUS

## 2016-05-22 MED ORDER — ONDANSETRON HCL 4 MG/2ML IJ SOLN: 4.0000 mg | Freq: Once | INTRAMUSCULAR | Status: DC | PRN

## 2016-05-22 MED ORDER — LACTATED RINGERS IV SOLN
INTRAVENOUS | Status: DC | PRN
  Administered 2016-05-22: 13:00:00 via INTRAVENOUS

## 2016-05-22 SURGICAL SUPPLY — 62 items
APPLIER CLIP 11 MED OPEN (CLIP)
APPLIER CLIP 13 LRG OPEN (CLIP)
BLADE CLIPPER SURG (BLADE) ×3 IMPLANT
BLADE SURG 15 STRL LF DISP TIS (BLADE) ×1 IMPLANT
BLADE SURG 15 STRL SS (BLADE) ×2
BULB RESERV EVAC DRAIN JP 100C (MISCELLANEOUS) ×9 IMPLANT
CANISTER SUCT 1200ML W/VALVE (MISCELLANEOUS) ×3 IMPLANT
CANISTER SUCT 3000ML (MISCELLANEOUS) ×6 IMPLANT
CATH TRAY 16F METER LATEX (MISCELLANEOUS) ×3 IMPLANT
CHLORAPREP W/TINT 26ML (MISCELLANEOUS) ×3 IMPLANT
CLIP APPLIE 11 MED OPEN (CLIP) IMPLANT
CLIP APPLIE 13 LRG OPEN (CLIP) IMPLANT
DRAIN CHANNEL JP 19F (MISCELLANEOUS) ×9 IMPLANT
DRAPE LAPAROTOMY 100X77 ABD (DRAPES) ×3 IMPLANT
DRAPE TABLE BACK 80X90 (DRAPES) ×3 IMPLANT
DRSG TEGADERM 2-3/8X2-3/4 SM (GAUZE/BANDAGES/DRESSINGS) IMPLANT
DRSG TELFA 3X8 NADH (GAUZE/BANDAGES/DRESSINGS) IMPLANT
ELECT BLADE 6.5 EXT (BLADE) ×3 IMPLANT
ELECT EZSTD 165MM 6.5IN (MISCELLANEOUS) ×3
ELECT REM PT RETURN 9FT ADLT (ELECTROSURGICAL) ×3
ELECTRODE EZSTD 165MM 6.5IN (MISCELLANEOUS) ×1 IMPLANT
ELECTRODE REM PT RTRN 9FT ADLT (ELECTROSURGICAL) ×1 IMPLANT
GAUZE SPONGE 4X4 12PLY STRL (GAUZE/BANDAGES/DRESSINGS) IMPLANT
GLOVE BIO SURGEON STRL SZ7 (GLOVE) ×30 IMPLANT
GOWN STRL REUS W/ TWL LRG LVL3 (GOWN DISPOSABLE) ×9 IMPLANT
GOWN STRL REUS W/TWL LRG LVL3 (GOWN DISPOSABLE) ×18
HANDLE SUCTION POOLE (INSTRUMENTS) ×1 IMPLANT
HANDLE YANKAUER SUCT BULB TIP (MISCELLANEOUS) ×6 IMPLANT
KIT RM TURNOVER STRD PROC AR (KITS) ×3 IMPLANT
LABEL OR SOLS (LABEL) ×3 IMPLANT
LIGASURE IMPACT 36 18CM CVD LR (INSTRUMENTS) ×3 IMPLANT
NDL SAFETY 22GX1.5 (NEEDLE) ×3 IMPLANT
NEEDLE HYPO 25X1 1.5 SAFETY (NEEDLE) ×3 IMPLANT
NS IRRIG 1000ML POUR BTL (IV SOLUTION) ×12 IMPLANT
PACK BASIN MAJOR ARMC (MISCELLANEOUS) ×3 IMPLANT
PACK COLON CLEAN CLOSURE (MISCELLANEOUS) ×3 IMPLANT
SPONGE LAP 18X18 5 PK (GAUZE/BANDAGES/DRESSINGS) ×9 IMPLANT
SPONGE LAP 18X36 2PK (MISCELLANEOUS) ×6 IMPLANT
STAPLER CUT CVD 40MM BLUE (STAPLE) IMPLANT
STAPLER CUT CVD 40MM GREEN (STAPLE) ×3 IMPLANT
STAPLER PROXIMATE 75MM BLUE (STAPLE) ×3 IMPLANT
STAPLER SKIN PROX 35W (STAPLE) ×3 IMPLANT
SUCTION POOLE HANDLE (INSTRUMENTS) ×3
SUT ETHILON 3-0 (SUTURE) ×9 IMPLANT
SUT PDS AB 0 CT1 27 (SUTURE) ×6 IMPLANT
SUT PDS AB 1 TP1 96 (SUTURE) ×6 IMPLANT
SUT PROLENE 2 0 SH DA (SUTURE) ×3 IMPLANT
SUT SILK 2 0 (SUTURE) ×2
SUT SILK 2 0 SH CR/8 (SUTURE) ×3 IMPLANT
SUT SILK 2 0SH CR/8 30 (SUTURE) IMPLANT
SUT SILK 2-0 18XBRD TIE 12 (SUTURE) ×1 IMPLANT
SUT VIC AB 0 CT1 36 (SUTURE) ×6 IMPLANT
SUT VIC AB 2-0 SH 27 (SUTURE) ×4
SUT VIC AB 2-0 SH 27XBRD (SUTURE) ×2 IMPLANT
SUT VIC AB 3-0 SH 27 (SUTURE) ×12
SUT VIC AB 3-0 SH 27X BRD (SUTURE) ×6 IMPLANT
SYR 20CC LL (SYRINGE) ×3 IMPLANT
SYR 3ML LL SCALE MARK (SYRINGE) IMPLANT
TAPE MICROFOAM 4IN (TAPE) IMPLANT
TRAY FOLEY W/METER SILVER 16FR (SET/KITS/TRAYS/PACK) IMPLANT
TUBING CONNECTING 10 (TUBING) ×6 IMPLANT
TUBING CONNECTING 10' (TUBING) ×3

## 2016-05-22 NOTE — Progress Notes (Signed)
Notified Dr. Dahlia Byes that patient now believes that percocet is making him sick and nausea. Overall pt feels lightheaded, BP good pt slightly tachycardic. Per Dr. Dahlia Byes place order for stat Ct of abdomen pelvis with IV contrast. Also place patient on strictly NPO no ice chips or sips with meds.

## 2016-05-22 NOTE — Op Note (Addendum)
PROCEDURES: 1. Lysis of adhesions  ( approximately 30 min of total operative time) 2. Hartmann's Procedure 3. Drainage of intra-abdominal abscess 4. Takedown of splenic flexure  Pre-operative Diagnosis: Perforated diverticulitis  Post-operative Diagnosis: Same  Surgeon: Jules Husbands MD, FACS  Assistants: Nestor Lewandowsky, MD FACS  Anesthesia: General endotracheal anesthesia  ASA Class: 4   Surgeon: Caroleen Hamman , MD FACS  Anesthesia: Gen. with endotracheal tube  Findings: Perforated sigmoid diverticulitis with intra-abdominal abscess and purulent peritonitis Significant adhesions and inflammatory reaction from sigmoid to pelvic wall, interloop abscess, pelvic abscess  Estimated Blood Loss: 250 cc         Drains: 4 # 19 FR blake drain, ruq, luq, and pelvis         Specimens: sigmoid colon       Complications: none         Condition: stable  Procedure Details  The patient was seen again in the Holding Room. The benefits, complications, treatment options, and expected outcomes were discussed with the patient. The risks of bleeding, infection, recurrence of symptoms, failure to resolve symptoms,  bowel injury, any of which could require further surgery were reviewed with the patient.   The patient was taken to Operating Room, identified as Luc Vedder and the procedure verified.  A Time Out was held and the above information confirmed.  Prior to the induction of general anesthesia, antibiotic prophylaxis was administered. VTE prophylaxis was in place. General endotracheal anesthesia was then administered and tolerated well. After the induction, the abdomen was prepped with Chloraprep and draped in the sterile fashion. The patient was positioned in the supine position.   Midline laparotomy was performed in the standard fashion with a 10 blade knife and abdominal cavity was entered with electrocautery. There was obvious free air. We turned attention to the pelvis and there was  obvious perforated sigmoid diverticulitis with pelvic abscess that was drained after finger fracturing the colon. There was also couple of interloop abscesses that were drained as well. It was significant inflammatory reaction in the sigmoid colon. Due to the severe inflammation we had to use a Bookwalter retractor and a Star dissection from a lateral to medial fashion of the sigmoid colon. We were also able to take down the splenic flexure the standard fashion and divide the omentum from the transverse colon and from the splenic flexure. We selected an area of transection proximally right at the mid descending colon and this was performed using a GIA Endo GIA 75 standard load stapler. The mesentery of the sigmoid was divided with Ligasure device. We weren't able to identify the ureter secondary to severe degree of inflammation but we made sure that our dissection was as close to the colon as possible to avoid any injuries. We continued to perform more extensive loss of adhesions using a combination of cautery and finger fracture technique. Down the pelvis we were able to divide the mesentery of the mesorectum and there was significant involvement also of the proximal rectal area. We incised the peritoneum and were able to create a good reading circumferential window around the rectum. Using a contour green load stapler we divided the distal aspect of our specimen. As many was passed off there was no obvious cancer but there was evidence of active diverticulitis with perforation. I placed a couple of 2-0 Prolene sutures on the rectal stump. We irrigated the abdominal cavity with multiple liters of sterile water and placed for Blake drains one in the right upper quadrant  1 and the left upper quadrant and 2 in the pelvis under direct visualization. A defect was created in the abdominal wall to the left of the umbilicus and stump of the descending colon was brought through the colostomy defect. The colostomy stump was  well perfused with no evidence of ischemia. Midline laparotomy was closed using a continuous 0 PDS sutures in standard fashion. We decided to leave the wound open because of the degree of Contamination. The colostomy was matured in a standard fashion using 3-0 Vicryl. A colostomy appliance was placed as well as sterile dressing with a wet-to-dry for the midline laparotomy.  Exaparel  + marcaine was injected along the incision site.There were no immediate combination standard needle and laparotomy counts were correct.   Caroleen Hamman, MD, FACS

## 2016-05-22 NOTE — Progress Notes (Signed)
Notified OR that pt had a suspected reaction to morphine while in the hospital.

## 2016-05-22 NOTE — Progress Notes (Signed)
Per Dr. Dahlia Byes discontinue CT scan as pt is going to go to surgery

## 2016-05-22 NOTE — Anesthesia Preprocedure Evaluation (Signed)
Anesthesia Evaluation  Patient identified by MRN, date of birth, ID band Patient awake    Reviewed: Allergy & Precautions, H&P , NPO status , Patient's Chart, lab work & pertinent test results, reviewed documented beta blocker date and time   History of Anesthesia Complications Negative for: history of anesthetic complications  Airway Mallampati: II  TM Distance: >3 FB Neck ROM: full    Dental no notable dental hx. (+) Edentulous Upper, Upper Dentures, Missing, Poor Dentition   Pulmonary neg shortness of breath, neg sleep apnea, neg COPD, neg recent URI, Current Smoker,    Pulmonary exam normal breath sounds clear to auscultation       Cardiovascular Exercise Tolerance: Good negative cardio ROS Normal cardiovascular exam Rhythm:regular Rate:Normal     Neuro/Psych negative neurological ROS  negative psych ROS   GI/Hepatic negative GI ROS, Neg liver ROS,   Endo/Other  negative endocrine ROS  Renal/GU negative Renal ROS  negative genitourinary   Musculoskeletal   Abdominal   Peds  Hematology negative hematology ROS (+)   Anesthesia Other Findings History reviewed. No pertinent past medical history.   Reproductive/Obstetrics negative OB ROS                             Anesthesia Physical Anesthesia Plan  ASA: II  Anesthesia Plan: General   Post-op Pain Management:    Induction:   Airway Management Planned:   Additional Equipment:   Intra-op Plan:   Post-operative Plan:   Informed Consent: I have reviewed the patients History and Physical, chart, labs and discussed the procedure including the risks, benefits and alternatives for the proposed anesthesia with the patient or authorized representative who has indicated his/her understanding and acceptance.   Dental Advisory Given  Plan Discussed with: Anesthesiologist, CRNA and Surgeon  Anesthesia Plan Comments:          Anesthesia Quick Evaluation

## 2016-05-22 NOTE — Transfer of Care (Signed)
Immediate Anesthesia Transfer of Care Note  Patient: Clayton Butler  Procedure(s) Performed: Procedure(s): EXPLORATORY LAPAROTOMY (N/A) COLECTOMY WITH COLOSTOMY CREATION/HARTMANN PROCEDURE (N/A)  Patient Location: PACU  Anesthesia Type:General  Level of Consciousness: awake and alert   Airway & Oxygen Therapy: Patient Spontanous Breathing and Patient connected to face mask oxygen  Post-op Assessment: Report given to RN and Post -op Vital signs reviewed and stable  Post vital signs: Reviewed and stable  Last Vitals:  Filed Vitals:   05/22/16 0840 05/22/16 1636  BP: 120/85 150/87  Pulse: 115 127  Temp:  36 C  Resp:  18    Last Pain:  Filed Vitals:   05/22/16 1638  PainSc: 4       Patients Stated Pain Goal: 0 ([id number])  Complications: No apparent anesthesia complications

## 2016-05-22 NOTE — Progress Notes (Signed)
Diverticulitis PT did well all day yesterday but this am reports increase abdominal pain and girth, he now feels dizzy. He is hemodynamically adequate he's afebrile but now is tachycardic in the 110s to 120s. White count slight elevation but is normal up to 10.  PE nontoxic Abdomen: Tense and now his Guarding and rebound tenderness with peritoneal signs  A/P patient with worsening abdominal exam in need for urgent laparotomy and Hartman's procedure. Discussed with the patient in detail and he understands he is aware that he is going to get a colostomy bag and a sigmoid colectomy. Proceeded discussed with the patient detail, risk, benefits and possible complications including but not limited to: Bleeding, infection, re-interventions, fistulas, even death. I do not think there is any other alternative as the patient has failed medical therapy and is deteriorating.

## 2016-05-22 NOTE — Anesthesia Procedure Notes (Signed)
Procedure Name: Intubation Date/Time: 05/22/2016 1:29 PM Performed by: Johnna Acosta Pre-anesthesia Checklist: Patient identified, Emergency Drugs available, Suction available, Patient being monitored and Timeout performed Patient Re-evaluated:Patient Re-evaluated prior to inductionOxygen Delivery Method: Circle system utilized Preoxygenation: Pre-oxygenation with 100% oxygen Intubation Type: IV induction, Rapid sequence and Cricoid Pressure applied Laryngoscope Size: Miller and 2 Grade View: Grade I Tube type: Oral Tube size: 7.0 mm Number of attempts: 1 Airway Equipment and Method: Stylet Placement Confirmation: ETT inserted through vocal cords under direct vision,  positive ETCO2 and breath sounds checked- equal and bilateral Secured at: 24 cm Tube secured with: Tape Dental Injury: Teeth and Oropharynx as per pre-operative assessment

## 2016-05-22 NOTE — Op Note (Deleted)
PROCEDURES  1. Attempted Laparoscopic appendectomy 2. Extensive lysis of adhesions 3. Repair of enterotomy 4. Open appendectomy  Clayton Butler Date of operation:  05/22/2016  Indications: The patient presented with a history of  abdominal pain. Workup has revealed findings consistent with acute appendicitis.  Pre-operative Diagnosis: Acute appendicitis without mention of peritonitis  Post-operative Diagnosis: Same  Surgeon: Diego Pabon, MD, FACS  Anesthesia: General with endotracheal tube  Findings: Dense and thick adhesions from the small bowel to the abdominal wall, interloop dense adhesions, and omentum plastered to the abdominal wall. Unable to perform a laparoscopic procedure secondary to midline dense adhesions  Estimated Blood Loss: 100CC         Specimens: appendix          Procedure Details  The patient was seen again in the preop area. The options of surgery versus observation were reviewed with the patient and/or family. The risks of bleeding, infection, recurrence of symptoms, negative laparoscopy, potential for an open procedure, bowel injury, abscess or infection, were all reviewed as well. The patient was taken to Operating Room, identified as Clayton Butler and the procedure verified as laparoscopic appendectomy. A Time Out was held and the above information confirmed.  The patient was placed in the supine position and general anesthesia was induced.  Antibiotic prophylaxis was administered and VT E prophylaxis was in place. A Foley catheter was placed by the nursing staff.   The abdomen was prepped and draped in a sterile fashion. An SUPRAumbilical incision was made. A cutdown technique was used to enter the abdominal cavity. Two vicryl stitches were placed on the fascia and a Hasson trocar inserted. Pneumoperitoneum obtained. Two 5 mm ports were placed under direct visualization. Start to perform laparoscopic lysis of adhesions there were dense and thick and  extensive adhesions from the small bowel to the abdominal wall and I quickly realized after about 15 minutes of laparoscopic lysis of adhesion that there was very thick adhesions to be safely done on through my approach and we needed to perform an approach in the right lower quadrant. This point all the laparoscopic ports were removed and a right lower transverse  laparotomy incision was created, electrocautery was used to dissect through the cutaneous tissue and the fascia was elevated and the muscle was split. There was still extensive adhesions from the small bowel to the abdominal wall and also from the small bowel to small bowel. We lysed adhesions sharply with Metzenbaum scissors and upon doing this there was a loop of bowel that was so densely adherent to each other that I was unable to dissect the 2 loops of bowel without taking the seromuscular layer from the other side ( impossible not to create enterotomy given the degree of dense adhesion between the two bowel loops). Therefore an enterotomy was created this was repaired with a 2 layer fashion using 2-0 silk sutures and we made sure there was no evidence of any strictures. We continued our extensive lysis of adhesions and also were able to take down some adhesions from the small bowel to the sigmoid once we finally had the lyse of adhesions completed were able to run the small bowel and identified the ileocecal valve as well as the appendix. The appendix was mildly inflamed without evidence of perforation. The appendix was also not retrocecal fashion that made things more challenging. There was also significant adhesions from the pelvic wall to the appendix. 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 She also had a significant thickening of the abdominal wall fascia secondary to previous abdominal operations. Were able to divide the mesoappendix using the Harmonic scalpel and using the endoscopic blue load  stapler we divided the appendix in the standard fashion. Affect and Blake drain was placed in the right lower quadrant under direct sterilization. The abdominal cavity was irrigated and once again small bowel was run without any evidence of any injuries and a widely patent repair of the small bowel. And the abdominal wall musculature was closed in a 2 layer fashion with 2 layers of 0 PDS suture. Exparel was placed along the incision site for postoperative analgesia and all the skin incisions were closed with stapler. Needle and laparotomy counts were correct   The patient tolerated the procedure well. The sponge lap and needle count were correct at the end of the procedure.  The patient was taken to the recovery room in stable condition to be admitted for continued care.    Diego Pabon, MD FACS  

## 2016-05-23 ENCOUNTER — Inpatient Hospital Stay: Payer: BLUE CROSS/BLUE SHIELD

## 2016-05-23 ENCOUNTER — Encounter: Payer: Self-pay | Admitting: Surgery

## 2016-05-23 LAB — BASIC METABOLIC PANEL
ANION GAP: 5 (ref 5–15)
BUN: 18 mg/dL (ref 6–20)
CALCIUM: 7.8 mg/dL — AB (ref 8.9–10.3)
CO2: 24 mmol/L (ref 22–32)
CREATININE: 0.88 mg/dL (ref 0.61–1.24)
Chloride: 108 mmol/L (ref 101–111)
GFR calc Af Amer: >60 mL/min (ref >60)
GLUCOSE: 147 mg/dL — AB (ref 65–99)
Potassium: 3.6 mmol/L (ref 3.5–5.1)
Sodium: 137 mmol/L (ref 135–145)

## 2016-05-23 LAB — CBC
HCT: 34.3 % — ABNORMAL LOW (ref 40.0–52.0)
Hemoglobin: 11.9 g/dL — ABNORMAL LOW (ref 13.0–18.0)
MCH: 36.1 pg — AB (ref 26.0–34.0)
MCHC: 34.7 g/dL (ref 32.0–36.0)
MCV: 104.1 fL — AB (ref 80.0–100.0)
PLATELETS: 138 10*3/uL — AB (ref 150–440)
RBC: 3.3 MIL/uL — ABNORMAL LOW (ref 4.40–5.90)
RDW: 14 % (ref 11.5–14.5)
WBC: 7.6 10*3/uL (ref 3.8–10.6)

## 2016-05-23 MED ORDER — GABAPENTIN 300 MG PO CAPS
300.0000 mg | ORAL_CAPSULE | Freq: Three times a day (TID) | ORAL | Status: DC
  Administered 2016-05-23 – 2016-06-01 (×24): 300 mg via ORAL
  Filled 2016-05-23 (×26): qty 1

## 2016-05-23 NOTE — Progress Notes (Signed)
CC: POD # 1 s/p Hartmann's for perf diverticultiis   Subjective: Feels well, Pull out NGT it was bothering him, no emesis or nausea.  Good U/O Afebrile  Objective: Vital signs in last 24 hours: Temp:  [96.8 F (36 C)-98.2 F (36.8 C)] 97.6 F (36.4 C) (06/16 0539) Pulse Rate:  [107-127] 108 (06/16 0539) Resp:  [14-22] 18 (06/16 0859) BP: (106-154)/(62-89) 106/62 mmHg (06/16 0539) SpO2:  [90 %-100 %] 92 % (06/16 0859) Last BM Date: 05/20/16  Intake/Output from previous day: 06/15 0701 - 06/16 0700 In: 6567 [I.V.:5868; NG/GT:25; IV Piggyback:674] Out: 2170 [Urine:1425; Drains:495; Blood:250] Intake/Output this shift: Total I/O In: 822.3 [I.V.:822.3] Out: 115 [Drains:115]  Physical exam: NAD, alert Abd: soft, colostomy pink and patent, no gas or stool, drains serous  Midline wound covered   Lab Results: CBC   Recent Labs  05/22/16 0530 05/23/16 0436  WBC 10.6 7.6  HGB 13.9 11.9*  HCT 39.8* 34.3*  PLT 142* 138*   BMET  Recent Labs  05/22/16 0530 05/23/16 0436  NA 137 137  K 3.6 3.6  CL 108 108  CO2 21* 24  GLUCOSE 154* 147*  BUN 22* 18  CREATININE 0.91 0.88  CALCIUM 8.3* 7.8*   PT/INR  Recent Labs  05/21/16 0409  LABPROT 15.5*  INR 1.21   ABG No results for input(s): PHART, HCO3 in the last 72 hours.  Invalid input(s): PCO2, PO2  Studies/Results: No results found.  Anti-infectives: Anti-infectives    Start     Dose/Rate Route Frequency Ordered Stop   05/20/16 1330  piperacillin-tazobactam (ZOSYN) IVPB 3.375 g     3.375 g 12.5 mL/hr over 240 Minutes Intravenous Every 8 hours 05/20/16 1319     05/20/16 0945  piperacillin-tazobactam (ZOSYN) IVPB 3.375 g     3.375 g 100 mL/hr over 30 Minutes Intravenous  Once 05/20/16 0942 05/20/16 1137      Assessment/Plan: Clears Ambulate Continue A/Bs , expect prolonged course given the degree of contamination Keep foley u/o Wet/dry midline wound    Caroleen Hamman, MD,  FACS  05/23/2016

## 2016-05-23 NOTE — Progress Notes (Signed)
Notified Dr Burt Knack that patient just pulled out NG tube. Dr Burt Knack said it is okay to leave out at this time.

## 2016-05-23 NOTE — Progress Notes (Signed)
Dr.  Dahlia Byes was notified of elevated heart rate and respiration with SOB. New order received.

## 2016-05-23 NOTE — Progress Notes (Signed)
PT Cancellation Note  Patient Details Name: Clayton Butler MRN: [id number] DOB: Sep 16, 1961   Cancelled Treatment:    Reason Eval/Treat Not Completed: Patient not medically ready. RN requesting PT eval to be held at this time d/t pt's elevated HR, RR, and SOB. Will re-attempt PT eval at later date/time when pt is medically ready.    Tamalyn Wadsworth, SPT 05/23/2016, 3:08 PM

## 2016-05-23 NOTE — Anesthesia Postprocedure Evaluation (Signed)
Anesthesia Post Note  Patient: Clayton Butler  Procedure(s) Performed: Procedure(s) (LRB): EXPLORATORY LAPAROTOMY (N/A) COLECTOMY WITH COLOSTOMY CREATION/HARTMANN PROCEDURE (N/A)  Patient location during evaluation: PACU Anesthesia Type: General Level of consciousness: awake and alert Pain management: pain level controlled Vital Signs Assessment: post-procedure vital signs reviewed and stable Respiratory status: spontaneous breathing and respiratory function stable Cardiovascular status: stable Anesthetic complications: no    Last Vitals:  Filed Vitals:   05/23/16 1500 05/23/16 1510  BP:    Pulse: 127 124  Temp:    Resp: 25 19    Last Pain:  Filed Vitals:   05/23/16 1511  PainSc: 0-No pain                 Keene Gilkey K

## 2016-05-23 NOTE — Progress Notes (Signed)
Patient is sustaining heart rate  Of 104-106. No signs of acute distress. Heart rate does spikes to 128 with exertion. O2 at 2L [place]. O2 sats of 95%.Will continue to monitor.

## 2016-05-24 ENCOUNTER — Inpatient Hospital Stay: Payer: BLUE CROSS/BLUE SHIELD

## 2016-05-24 LAB — BASIC METABOLIC PANEL
ANION GAP: 7 (ref 5–15)
BUN: 17 mg/dL (ref 6–20)
CALCIUM: 8 mg/dL — AB (ref 8.9–10.3)
CO2: 25 mmol/L (ref 22–32)
Chloride: 107 mmol/L (ref 101–111)
Creatinine, Ser: 0.91 mg/dL (ref 0.61–1.24)
GLUCOSE: 137 mg/dL — AB (ref 65–99)
POTASSIUM: 3.1 mmol/L — AB (ref 3.5–5.1)
Sodium: 139 mmol/L (ref 135–145)

## 2016-05-24 LAB — CBC
HEMATOCRIT: 34.6 % — AB (ref 40.0–52.0)
HEMOGLOBIN: 12.1 g/dL — AB (ref 13.0–18.0)
MCH: 36.5 pg — ABNORMAL HIGH (ref 26.0–34.0)
MCHC: 34.9 g/dL (ref 32.0–36.0)
MCV: 104.5 fL — AB (ref 80.0–100.0)
Platelets: 169 10*3/uL (ref 150–440)
RBC: 3.32 MIL/uL — AB (ref 4.40–5.90)
RDW: 14.4 % (ref 11.5–14.5)
WBC: 9.2 10*3/uL (ref 3.8–10.6)

## 2016-05-24 LAB — POTASSIUM: Potassium: 3.4 mmol/L — ABNORMAL LOW (ref 3.5–5.1)

## 2016-05-24 LAB — PHOSPHORUS: PHOSPHORUS: 2.8 mg/dL (ref 2.5–4.6)

## 2016-05-24 LAB — MAGNESIUM: MAGNESIUM: 1.8 mg/dL (ref 1.7–2.4)

## 2016-05-24 MED ORDER — POTASSIUM CHLORIDE CRYS ER 20 MEQ PO TBCR: 40.0000 meq | EXTENDED_RELEASE_TABLET | Freq: Once | ORAL | Status: DC

## 2016-05-24 MED ORDER — PANTOPRAZOLE SODIUM 40 MG PO TBEC
40.0000 mg | DELAYED_RELEASE_TABLET | Freq: Once | ORAL | Status: AC
  Administered 2016-05-24: 40 mg via ORAL
  Filled 2016-05-24: qty 1

## 2016-05-24 MED ORDER — HYDROMORPHONE HCL 1 MG/ML IJ SOLN
0.5000 mg | INTRAMUSCULAR | Status: DC | PRN
  Administered 2016-05-27 (×2): 0.5 mg via INTRAVENOUS
  Filled 2016-05-24 (×2): qty 1

## 2016-05-24 MED ORDER — POTASSIUM CHLORIDE 10 MEQ/100ML IV SOLN
10.0000 meq | INTRAVENOUS | Status: AC
  Administered 2016-05-24 (×2): 10 meq via INTRAVENOUS
  Filled 2016-05-24 (×2): qty 100

## 2016-05-24 MED ORDER — CALCIUM CARBONATE ANTACID 500 MG PO CHEW
1.0000 | CHEWABLE_TABLET | ORAL | Status: DC | PRN
  Administered 2016-05-24 – 2016-05-28 (×8): 200 mg via ORAL
  Filled 2016-05-24 (×8): qty 1

## 2016-05-24 MED ORDER — POTASSIUM CHLORIDE 10 MEQ/100ML IV SOLN
10.0000 meq | INTRAVENOUS | Status: AC
  Administered 2016-05-24 (×4): 10 meq via INTRAVENOUS
  Filled 2016-05-24 (×4): qty 100

## 2016-05-24 NOTE — Progress Notes (Signed)
MEDICATION RELATED CONSULT NOTE - INITIAL   Pharmacy Consult for Electrolyte Supplementation Indication: hypokalemia  No Known Allergies  Patient Measurements: Height: 6\' 2"  (188 cm) Weight: 210 lb (95.255 kg) IBW/kg (Calculated) : 82.2   Vital Signs: Temp: 97.6 F (36.4 C) (06/17 1226) Temp Source: Oral (06/17 1226) BP: 130/90 mmHg (06/17 1226) Pulse Rate: 93 (06/17 1226) Intake/Output from previous day: 06/16 0701 - 06/17 0700 In: 3890.2 [P.O.:1230; I.V.:2660.2] Out: 1633 [Urine:850; Emesis/NG output:75; Drains:708] Intake/Output from this shift:    Labs:  Recent Labs  05/22/16 0530 05/23/16 0436 05/24/16 0543  WBC 10.6 7.6 9.2  HGB 13.9 11.9* 12.1*  HCT 39.8* 34.3* 34.6*  PLT 142* 138* 169  CREATININE 0.91 0.88 0.91  MG  --   --  1.8  PHOS  --   --  2.8   Estimated Creatinine Clearance: 107.9 mL/min (by C-G formula based on Cr of 0.91).   Microbiology: Recent Results (from the past 720 hour(s))  Blood culture (routine x 2)     Status: None (Preliminary result)   Collection Time: 05/20/16 10:30 AM  Result Value Ref Range Status   Specimen Description BLOOD LEFT ARM  Final   Special Requests BOTTLES DRAWN AEROBIC AND ANAEROBIC 6CC  Final   Culture NO GROWTH 3 DAYS  Final   Report Status PENDING  Incomplete  Blood culture (routine x 2)     Status: None (Preliminary result)   Collection Time: 05/20/16 10:30 AM  Result Value Ref Range Status   Specimen Description BLOOD LEFT ARM  Final   Special Requests BOTTLES DRAWN AEROBIC AND ANAEROBIC 4CC  Final   Culture NO GROWTH 3 DAYS  Final   Report Status PENDING  Incomplete    Medical History: History reviewed. No pertinent past medical history.  Medications:  Scheduled:  . enoxaparin (LOVENOX) injection  40 mg Subcutaneous Q24H  . famotidine (PEPCID) IV  20 mg Intravenous Q12H  . gabapentin  300 mg Oral TID  . ketorolac  30 mg Intravenous Q6H  . nicotine  14 mg Transdermal Daily  .  piperacillin-tazobactam (ZOSYN)  IV  3.375 g Intravenous Q8H   Infusions:  . dextrose 5 % and 0.9% NaCl 125 mL/hr at 05/24/16 1608    Assessment: Pharmacy consulted to supplement electrolytes in a 55 yo male admitted with diverticulitis.   K: 3.4, Mag: 1.8, Phos: 2.8   Plan:  Will supplement with potassium chloride 10 meq IV x 2 as patient is NPO. Will recheck level in the AM  Lillybeth Tal D Matyas Baisley, Pharm.D Clinical Pharmacist   05/24/2016

## 2016-05-24 NOTE — Progress Notes (Signed)
CC: s/p Hartmann's POD # 2  Subjective: Nausea, pain is better and not using PCA  Objective: Vital signs in last 24 hours: Temp:  [97.6 F (36.4 C)-98.1 F (36.7 C)] 97.7 F (36.5 C) (06/17 0533) Pulse Rate:  [95-127] 95 (06/17 0533) Resp:  [16-25] 23 (06/17 0753) BP: (127-146)/(64-86) 143/84 mmHg (06/17 0533) SpO2:  [92 %-97 %] 94 % (06/17 0753) Last BM Date: 05/20/16  Intake/Output from previous day: 06/16 0701 - 06/17 0700 In: 3890.2 [P.O.:1230; I.V.:2660.2] Out: 1633 [Urine:850; Emesis/NG output:75; Drains:708] Intake/Output this shift: Total I/O In: 685 [I.V.:685] Out: 200 [Urine:150; Drains:50]  Physical exam: NAD Abd: mildly distended, dec BS, ostomy pink, no stool or output  Lab Results: CBC   Recent Labs  05/23/16 0436 05/24/16 0543  WBC 7.6 9.2  HGB 11.9* 12.1*  HCT 34.3* 34.6*  PLT 138* 169   BMET  Recent Labs  05/23/16 0436 05/24/16 0543  NA 137 139  K 3.6 3.1*  CL 108 107  CO2 24 25  GLUCOSE 147* 137*  BUN 18 17  CREATININE 0.88 0.91  CALCIUM 7.8* 8.0*   PT/INR No results for input(s): LABPROT, INR in the last 72 hours. ABG No results for input(s): PHART, HCO3 in the last 72 hours.  Invalid input(s): PCO2, PO2  Studies/Results: Dg Chest 1 View  05/23/2016  CLINICAL DATA:  Shortness of Breath EXAM: CHEST 1 VIEW COMPARISON:  None. FINDINGS: There is airspace consolidation in the medial left base overlying the left heart border. There is subtle increased opacity in the right mid lung region. Lungs elsewhere clear. Heart is borderline prominent with pulmonary vascularity within normal limits. No adenopathy. No bone lesions. IMPRESSION: Airspace consolidation left base overlying the left heart border. Subtle opacity right mid lung, concerning for a second focus of pneumonia. Heart borderline prominent. Followup PA and lateral chest radiographs recommended in 3-4 weeks following trial of antibiotic therapy to ensure resolution and exclude  underlying malignancy. Electronically Signed   By: Lowella Grip III M.D.   On: 05/23/2016 15:26    Anti-infectives: Anti-infectives    Start     Dose/Rate Route Frequency Ordered Stop   05/20/16 1330  piperacillin-tazobactam (ZOSYN) IVPB 3.375 g     3.375 g 12.5 mL/hr over 240 Minutes Intravenous Every 8 hours 05/20/16 1319     05/20/16 0945  piperacillin-tazobactam (ZOSYN) IVPB 3.375 g     3.375 g 100 mL/hr over 30 Minutes Intravenous  Once 05/20/16 0942 05/20/16 1137      Assessment/Plan: Ileus, NPO and NGT Mobilize Continue A/Bs [place], MD, FACS  05/24/2016

## 2016-05-24 NOTE — Progress Notes (Signed)
MEDICATION RELATED CONSULT NOTE - INITIAL   Pharmacy Consult for Electrolyte Supplementation Indication: hypokalemia  No Known Allergies  Patient Measurements: Height: 6\' 2"  (188 cm) Weight: 210 lb (95.255 kg) IBW/kg (Calculated) : 82.2   Vital Signs: Temp: 97.7 F (36.5 C) (06/17 0533) Temp Source: Oral (06/17 0533) BP: 143/84 mmHg (06/17 0533) Pulse Rate: 95 (06/17 0533) Intake/Output from previous day: 06/16 0701 - 06/17 0700 In: 3890.2 [P.O.:1230; I.V.:2660.2] Out: 1633 [Urine:850; Emesis/NG output:75; Drains:708] Intake/Output from this shift:    Labs:  Recent Labs  05/22/16 0530 05/23/16 0436 05/24/16 0543  WBC 10.6 7.6 9.2  HGB 13.9 11.9* 12.1*  HCT 39.8* 34.3* 34.6*  PLT 142* 138* 169  CREATININE 0.91 0.88 0.91  MG  --   --  1.8  PHOS  --   --  2.8   Estimated Creatinine Clearance: 107.9 mL/min (by C-G formula based on Cr of 0.91).   Microbiology: Recent Results (from the past 720 hour(s))  Blood culture (routine x 2)     Status: None (Preliminary result)   Collection Time: 05/20/16 10:30 AM  Result Value Ref Range Status   Specimen Description BLOOD LEFT ARM  Final   Special Requests BOTTLES DRAWN AEROBIC AND ANAEROBIC 6CC  Final   Culture NO GROWTH 3 DAYS  Final   Report Status PENDING  Incomplete  Blood culture (routine x 2)     Status: None (Preliminary result)   Collection Time: 05/20/16 10:30 AM  Result Value Ref Range Status   Specimen Description BLOOD LEFT ARM  Final   Special Requests BOTTLES DRAWN AEROBIC AND ANAEROBIC 4CC  Final   Culture NO GROWTH 3 DAYS  Final   Report Status PENDING  Incomplete    Medical History: History reviewed. No pertinent past medical history.  Medications:  Scheduled:  . enoxaparin (LOVENOX) injection  40 mg Subcutaneous Q24H  . famotidine (PEPCID) IV  20 mg Intravenous Q12H  . gabapentin  300 mg Oral TID  . HYDROmorphone   Intravenous Q4H  . ketorolac  30 mg Intravenous Q6H  . nicotine  14 mg  Transdermal Daily  . piperacillin-tazobactam (ZOSYN)  IV  3.375 g Intravenous Q8H  . potassium chloride  10 mEq Intravenous Q1 Hr x 4   Infusions:  . dextrose 5 % and 0.9% NaCl 75 mL/hr at 05/24/16 [id number]    Assessment: Pharmacy consulted to supplement electrolytes in a 55 yo male admitted with diverticulitis.   K: 3.1, Mag: 1.8, Phos: 2.8   Plan:  Will supplement with potassium chloride 10 meq IV x 4 as patient is vomiting per RN.  Will recheck potassium level at 1800 today to ensure no further supplementation is warranted.  Labs ordered for AM.   Murrell Converse, PharmD Clinical Pharmacist 05/24/2016

## 2016-05-24 NOTE — Progress Notes (Signed)
Pt did vomit this AM about 75 mls green emesis.

## 2016-05-24 NOTE — Progress Notes (Signed)
Pt. Complained of heart burn in the epigastric area and throat with some nausea and spitting up throughout shift. MD made aware and Tums and Protonix ordered an admin. Zofran also admin. Pt noted minimal. relief. Left #2 JP putting out large amounts of serosanguineous drainage.150 mls this shift.

## 2016-05-24 NOTE — Evaluation (Signed)
Physical Therapy Evaluation Patient Details Name: Clayton Butler MRN: [id number] DOB: 11-03-1961 Today's Date: 05/24/2016   History of Present Illness  55 yo male with onset of abd pain and  discovery of diverticulitis with perforation.  On NG tube with suction, GI doctor notes ileus and has NPO.  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    Clinical Impression  Pt was seen for evaluation of his mobility after colectomy and colostomy, but is quite weak and unable to do any moving without signficant help, including managing drains and IV's.  With some practice moving should be able to downgrade to HHPT but will depend on how quickly pt is leaving the hospital.  Will continue with his mobility as he can tolerate acutely and progress to stairs and longer distances.    Follow Up Recommendations SNF    Equipment Recommendations  None recommended by PT    Recommendations for Other Services Rehab consult     Precautions / Restrictions Precautions Precautions: Fall (NG tube on suction) Restrictions Weight Bearing Restrictions: No      Mobility  Bed Mobility Overal bed mobility: Needs Assistance Bed Mobility: Supine to Sit     Supine to sit: Min assist;Mod assist     General bed mobility comments: assisted carefully to slide to EOB with bed pad due to drainage from JP drains on bed  Transfers Overall transfer level: Needs assistance Equipment used: Rolling walker (2 wheeled);1 person hand held assist Transfers: Sit to/from [facility] Sit to Stand: Mod assist;Min assist Stand pivot transfers: Min assist;From elevated surface (cues for sequence)       General transfer comment: Pt feeling weak and in need of close guarding  Ambulation/Gait Ambulation/Gait assistance: Min assist (careful to steady and control pace) Ambulation Distance (Feet): 18 Feet Assistive device: Rolling walker (2 wheeled);1 person hand held assist Gait  Pattern/deviations: Step-through pattern;Step-to pattern;Leaning posteriorly;Drifts right/left;Wide base of support;Decreased stride length Gait velocity: reduced Gait velocity interpretation: Below normal speed for age/gender General Gait Details: pt is weak on his feet  Stairs            Wheelchair Mobility    Modified Rankin (Stroke Patients Only)       Balance Overall balance assessment: Needs assistance Sitting-balance support: Feet supported Sitting balance-Leahy Scale: Fair   Postural control: Posterior lean Standing balance support: Bilateral upper extremity supported Standing balance-Leahy Scale: Poor Standing balance comment: pt is rocking on ankles at times wide shuffling and if stepping backward very short steps                             Pertinent Vitals/Pain Pain Assessment: Faces Faces Pain Scale: Hurts little more Pain Location: abdominal area Pain Descriptors / Indicators: Operative site guarding Pain Intervention(s): Limited activity within patient's tolerance;Monitored during session;Premedicated before session;Repositioned    Home Living Family/patient expects to be discharged to:: Private residence Living Arrangements: Alone   Type of Home: House         Home Equipment: None      Prior Function Level of Independence: Independent               Hand Dominance        Extremity/Trunk Assessment   Upper Extremity Assessment: Overall WFL for tasks assessed           Lower Extremity Assessment: Generalized weakness      Cervical / Trunk Assessment: Normal  Communication   Communication: No difficulties  Cognition Arousal/Alertness: Awake/alert Behavior During Therapy: WFL for tasks assessed/performed Overall Cognitive Status: Within Functional Limits for tasks assessed                      General Comments      Exercises        Assessment/Plan    PT Assessment Patient needs continued PT  services  PT Diagnosis Difficulty walking   PT Problem List Decreased strength;Decreased range of motion;Decreased activity tolerance;Decreased balance;Decreased mobility;Decreased coordination;Decreased knowledge of use of DME;Decreased safety awareness;Decreased knowledge of precautions;Cardiopulmonary status limiting activity;Decreased skin integrity;Pain  PT Treatment Interventions DME instruction;Gait training;Stair training;Functional mobility training;Therapeutic activities;Therapeutic exercise;Balance training;Neuromuscular re-education;Patient/family education   PT Goals (Current goals can be found in the Care Plan section) Acute Rehab PT Goals Patient Stated Goal: to feel better PT Goal Formulation: With patient Time For Goal Achievement: 06/07/16 Potential to Achieve Goals: Good    Frequency Min 2X/week   Barriers to discharge Decreased caregiver support      Co-evaluation               End of Session Equipment Utilized During Treatment: Oxygen Activity Tolerance: Patient limited by fatigue;Patient limited by lethargy Patient left: in chair;with call bell/phone within reach;with chair alarm set;with family/visitor present Nurse Communication: Mobility status         Time: [id number] PT Time Calculation (min) (ACUTE ONLY): 27 min   Charges:   PT Evaluation $PT Eval Moderate Complexity: 1 Procedure PT Treatments $Gait Training: 8-22 mins   PT G CodesRamond Dial 06-14-16, 4:49 PM   Mee Hives, PT MS Acute Rehab Dept. Number: [place] and [place]

## 2016-05-24 NOTE — Progress Notes (Signed)
PT Cancellation Note  Patient Details Name: Clayton Butler MRN: [id number] DOB: March 10, 1961   Cancelled Treatment:    Reason Eval/Treat Not Completed: Patient not medically ready.  Receiving g tube and will try later as time and pt allow.   Ramond Dial 05/24/2016, 10:47 AM    Mee Hives, PT MS Acute Rehab Dept. Number: [place] and [place]

## 2016-05-25 LAB — BASIC METABOLIC PANEL
ANION GAP: 4 — AB (ref 5–15)
BUN: 18 mg/dL (ref 6–20)
CO2: 29 mmol/L (ref 22–32)
Calcium: 7.7 mg/dL — ABNORMAL LOW (ref 8.9–10.3)
Chloride: 107 mmol/L (ref 101–111)
Creatinine, Ser: 0.92 mg/dL (ref 0.61–1.24)
GFR calc non Af Amer: >60 mL/min (ref >60)
GLUCOSE: 107 mg/dL — AB (ref 65–99)
POTASSIUM: 3.2 mmol/L — AB (ref 3.5–5.1)
Sodium: 140 mmol/L (ref 135–145)

## 2016-05-25 LAB — PHOSPHORUS: Phosphorus: 3.2 mg/dL (ref 2.5–4.6)

## 2016-05-25 LAB — CULTURE, BLOOD (ROUTINE X 2)
CULTURE: NO GROWTH
CULTURE: NO GROWTH

## 2016-05-25 LAB — CBC
HEMATOCRIT: 33.8 % — AB (ref 40.0–52.0)
Hemoglobin: 11.7 g/dL — ABNORMAL LOW (ref 13.0–18.0)
MCH: 35.8 pg — ABNORMAL HIGH (ref 26.0–34.0)
MCHC: 34.6 g/dL (ref 32.0–36.0)
MCV: 103.4 fL — AB (ref 80.0–100.0)
PLATELETS: 173 10*3/uL (ref 150–440)
RBC: 3.26 MIL/uL — AB (ref 4.40–5.90)
RDW: 14.3 % (ref 11.5–14.5)
WBC: 6.6 10*3/uL (ref 3.8–10.6)

## 2016-05-25 LAB — MAGNESIUM: Magnesium: 1.8 mg/dL (ref 1.7–2.4)

## 2016-05-25 LAB — POTASSIUM: Potassium: 3.3 mmol/L — ABNORMAL LOW (ref 3.5–5.1)

## 2016-05-25 MED ORDER — POTASSIUM CHLORIDE 10 MEQ/100ML IV SOLN
10.0000 meq | INTRAVENOUS | Status: AC
  Administered 2016-05-25 – 2016-05-26 (×4): 10 meq via INTRAVENOUS
  Filled 2016-05-25 (×4): qty 100

## 2016-05-25 MED ORDER — POTASSIUM CHLORIDE 10 MEQ/100ML IV SOLN
10.0000 meq | INTRAVENOUS | Status: AC
  Administered 2016-05-25 (×4): 10 meq via INTRAVENOUS
  Filled 2016-05-25 (×4): qty 100

## 2016-05-25 MED ORDER — MAGNESIUM SULFATE 2 GM/50ML IV SOLN
2.0000 g | Freq: Once | INTRAVENOUS | Status: AC
  Administered 2016-05-25: 2 g via INTRAVENOUS
  Filled 2016-05-25: qty 50

## 2016-05-25 NOTE — Progress Notes (Signed)
MEDICATION RELATED CONSULT NOTE - INITIAL   Pharmacy Consult for Electrolyte Supplementation Indication: hypokalemia  No Known Allergies  Patient Measurements: Height: 6\' 2"  (188 cm) Weight: 210 lb (95.255 kg) IBW/kg (Calculated) : 82.2   Vital Signs: Temp: 98 F (36.7 C) (06/18 1335) Temp Source: Oral (06/18 1335) BP: 131/81 mmHg (06/18 1335) Pulse Rate: 85 (06/18 1335) Intake/Output from previous day: 06/17 0701 - 06/18 0700 In: 2880.7 [I.V.:2830.7; IV Piggyback:50] Out: 2650 [Urine:425; Emesis/NG output:1840; Drains:385] Intake/Output from this shift: Total I/O In: 382 [I.V.:382] Out: 350 [Urine:350]  Labs:  Recent Labs  05/23/16 0436 05/24/16 0543 05/25/16 0443  WBC 7.6 9.2 6.6  HGB 11.9* 12.1* 11.7*  HCT 34.3* 34.6* 33.8*  PLT 138* 169 173  CREATININE 0.88 0.91 0.92  MG  --  1.8 1.8  PHOS  --  2.8 3.2   Estimated Creatinine Clearance: 106.7 mL/min (by C-G formula based on Cr of 0.92).   Microbiology: Recent Results (from the past 720 hour(s))  Blood culture (routine x 2)     Status: None   Collection Time: 05/20/16 10:30 AM  Result Value Ref Range Status   Specimen Description BLOOD LEFT ARM  Final   Special Requests BOTTLES DRAWN AEROBIC AND ANAEROBIC 6CC  Final   Culture NO GROWTH 5 DAYS  Final   Report Status 05/25/2016 FINAL  Final  Blood culture (routine x 2)     Status: None   Collection Time: 05/20/16 10:30 AM  Result Value Ref Range Status   Specimen Description BLOOD LEFT ARM  Final   Special Requests BOTTLES DRAWN AEROBIC AND ANAEROBIC 4CC  Final   Culture NO GROWTH 5 DAYS  Final   Report Status 05/25/2016 FINAL  Final    Medical History: History reviewed. No pertinent past medical history.  Medications:  Scheduled:  . enoxaparin (LOVENOX) injection  40 mg Subcutaneous Q24H  . famotidine (PEPCID) IV  20 mg Intravenous Q12H  . gabapentin  300 mg Oral TID  . nicotine  14 mg Transdermal Daily  . piperacillin-tazobactam (ZOSYN)  IV   3.375 g Intravenous Q8H  . potassium chloride  10 mEq Intravenous Q1 Hr x 4   Infusions:  . dextrose 5 % and 0.9% NaCl 125 mL/hr at 05/25/16 0744    Assessment: Pharmacy consulted to supplement electrolytes in a 55 yo male admitted with diverticulitis.   K: 3.3, Mag: 1.8, Phos: 3.2   Plan:  Pt received potassium chloride 10 meq IV x 4 as well as magnesium 2gm IV x 1 this afternoon. K remains low at 3.3. Will give another 4 runs of KCL 10 MEQ IV. Will recheck all electrolytes in the AM.  Ramond Dial, Pharm.D Clinical Pharmacist  05/25/2016 7:57 PM

## 2016-05-25 NOTE — Progress Notes (Signed)
MEDICATION RELATED CONSULT NOTE - INITIAL   Pharmacy Consult for Electrolyte Supplementation Indication: hypokalemia  No Known Allergies  Patient Measurements: Height: 6\' 2"  (188 cm) Weight: 210 lb (95.255 kg) IBW/kg (Calculated) : 82.2   Vital Signs: Temp: 98.5 F (36.9 C) (06/18 0441) Temp Source: Oral (06/17 2023) BP: 122/76 mmHg (06/18 0441) Pulse Rate: 90 (06/18 0441) Intake/Output from previous day: 06/17 0701 - 06/18 0700 In: 2880.7 [I.V.:2830.7; IV Piggyback:50] Out: 2650 [Urine:425; Emesis/NG output:1840; Drains:385] Intake/Output from this shift:    Labs:  Recent Labs  05/23/16 0436 05/24/16 0543 05/25/16 0443  WBC 7.6 9.2 6.6  HGB 11.9* 12.1* 11.7*  HCT 34.3* 34.6* 33.8*  PLT 138* 169 173  CREATININE 0.88 0.91 0.92  MG  --  1.8 1.8  PHOS  --  2.8 3.2   Estimated Creatinine Clearance: 106.7 mL/min (by C-G formula based on Cr of 0.92).   Microbiology: Recent Results (from the past 720 hour(s))  Blood culture (routine x 2)     Status: None (Preliminary result)   Collection Time: 05/20/16 10:30 AM  Result Value Ref Range Status   Specimen Description BLOOD LEFT ARM  Final   Special Requests BOTTLES DRAWN AEROBIC AND ANAEROBIC 6CC  Final   Culture NO GROWTH 3 DAYS  Final   Report Status PENDING  Incomplete  Blood culture (routine x 2)     Status: None (Preliminary result)   Collection Time: 05/20/16 10:30 AM  Result Value Ref Range Status   Specimen Description BLOOD LEFT ARM  Final   Special Requests BOTTLES DRAWN AEROBIC AND ANAEROBIC 4CC  Final   Culture NO GROWTH 3 DAYS  Final   Report Status PENDING  Incomplete    Medical History: History reviewed. No pertinent past medical history.  Medications:  Scheduled:  . enoxaparin (LOVENOX) injection  40 mg Subcutaneous Q24H  . famotidine (PEPCID) IV  20 mg Intravenous Q12H  . gabapentin  300 mg Oral TID  . ketorolac  30 mg Intravenous Q6H  . magnesium sulfate 1 - 4 g bolus IVPB  2 g  Intravenous Once  . nicotine  14 mg Transdermal Daily  . piperacillin-tazobactam (ZOSYN)  IV  3.375 g Intravenous Q8H  . potassium chloride  10 mEq Intravenous Q1 Hr x 4   Infusions:  . dextrose 5 % and 0.9% NaCl 125 mL/hr at 05/25/16 0744    Assessment: Pharmacy consulted to supplement electrolytes in a 55 yo male admitted with diverticulitis.   K: 3.2, Mag: 1.8, Phos: 3.2   Plan:  Will supplement with potassium chloride 10 meq IV x 4 as well as magnesium 2gm IV x 1. Recheck potassium at 1800.  Rexene Edison, PharmD Clinical Pharmacist 05/25/2016 8:07 AM

## 2016-05-25 NOTE — Progress Notes (Signed)
CC: S/P hartmann's Subjective: Feeling great, no output  Or gas from his colostomy  Objective: Vital signs in last 24 hours: Temp:  [97.6 F (36.4 C)-98.5 F (36.9 C)] 98 F (36.7 C) (06/18 0803) Pulse Rate:  [85-93] 85 (06/18 0803) Resp:  [16-18] 16 (06/18 0803) BP: (122-130)/(76-90) 128/80 mmHg (06/18 0803) SpO2:  [96 %-100 %] 99 % (06/18 0803) Last BM Date: 05/20/16  Intake/Output from previous day: 06/17 0701 - 06/18 0700 In: 2880.7 [I.V.:2830.7; IV Piggyback:50] Out: 2650 [Urine:425; Emesis/NG output:1840; Drains:385] Intake/Output this shift:    Physical exam: NAD Abd: soft, mildly distended. Midline wound open healing well, JPs serous, colostomy patent, some purple areas but viable  Lab Results: CBC   Recent Labs  05/24/16 0543 05/25/16 0443  WBC 9.2 6.6  HGB 12.1* 11.7*  HCT 34.6* 33.8*  PLT 169 173   BMET  Recent Labs  05/24/16 0543 05/24/16 1746 05/25/16 0443  NA 139  --  140  K 3.1* 3.4* 3.2*  CL 107  --  107  CO2 25  --  29  GLUCOSE 137*  --  107*  BUN 17  --  18  CREATININE 0.91  --  0.92  CALCIUM 8.0*  --  7.7*   PT/INR No results for input(s): LABPROT, INR in the last 72 hours. ABG No results for input(s): PHART, HCO3 in the last 72 hours.  Invalid input(s): PCO2, PO2  Studies/Results: Dg Chest 1 View  05/23/2016  CLINICAL DATA:  Shortness of Breath EXAM: CHEST 1 VIEW COMPARISON:  None. FINDINGS: There is airspace consolidation in the medial left base overlying the left heart border. There is subtle increased opacity in the right mid lung region. Lungs elsewhere clear. Heart is borderline prominent with pulmonary vascularity within normal limits. No adenopathy. No bone lesions. IMPRESSION: Airspace consolidation left base overlying the left heart border. Subtle opacity right mid lung, concerning for a second focus of pneumonia. Heart borderline prominent. Followup PA and lateral chest radiographs recommended in 3-4 weeks following trial  of antibiotic therapy to ensure resolution and exclude underlying malignancy. Electronically Signed   By: Lowella Grip III M.D.   On: 05/23/2016 15:26   Dg Abd 1 View  05/24/2016  CLINICAL DATA:  Patient with NG tube placement after surgery. EXAM: ABDOMEN - 1 VIEW COMPARISON:  CT 05/20/2016.  Chest radiograph 05/23/2016. FINDINGS: Drainage catheters project over the right and left hemi abdomen. Enteric tube tip projects over the stomach. Small left pleural effusion and heterogeneous opacities left lung base. Mildly gaseous distended loops of small bowel. Gas in the colon. IMPRESSION: Enteric tube tip and side-port project over the stomach. Possible ileus. Electronically Signed   By: Lovey Newcomer M.D.   On: 05/24/2016 11:43    Anti-infectives: Anti-infectives    Start     Dose/Rate Route Frequency Ordered Stop   05/20/16 1330  piperacillin-tazobactam (ZOSYN) IVPB 3.375 g     3.375 g 12.5 mL/hr over 240 Minutes Intravenous Every 8 hours 05/20/16 1319     05/20/16 0945  piperacillin-tazobactam (ZOSYN) IVPB 3.375 g     3.375 g 100 mL/hr over 30 Minutes Intravenous  Once 05/20/16 0942 05/20/16 1137      Assessment/Plan: S/p Hartmann's Ileus keep NGT today, may DC it probably tomorrow continue A/Bs 2 week course due to purulent peritonitis Keep drains, may start pulling one at a time once his condition starts to improve Ostomy Nurse in am Replace lytes If continues w ileus , he will need  TPN Pending path   Caroleen Hamman, MD, [facility]  05/25/2016

## 2016-05-26 LAB — COMPREHENSIVE METABOLIC PANEL
ALK PHOS: 45 U/L (ref 38–126)
ALT: 21 U/L (ref 17–63)
AST: 23 U/L (ref 15–41)
Albumin: 2.2 g/dL — ABNORMAL LOW (ref 3.5–5.0)
Anion gap: 6 (ref 5–15)
BUN: 12 mg/dL (ref 6–20)
CALCIUM: 7.6 mg/dL — AB (ref 8.9–10.3)
CO2: 26 mmol/L (ref 22–32)
CREATININE: 0.83 mg/dL (ref 0.61–1.24)
Chloride: 106 mmol/L (ref 101–111)
Glucose, Bld: 107 mg/dL — ABNORMAL HIGH (ref 65–99)
Potassium: 3.4 mmol/L — ABNORMAL LOW (ref 3.5–5.1)
Sodium: 138 mmol/L (ref 135–145)
Total Bilirubin: 1 mg/dL (ref 0.3–1.2)
Total Protein: 5.1 g/dL — ABNORMAL LOW (ref 6.5–8.1)

## 2016-05-26 LAB — MAGNESIUM: Magnesium: 1.9 mg/dL (ref 1.7–2.4)

## 2016-05-26 LAB — CBC
HCT: 34.8 % — ABNORMAL LOW (ref 40.0–52.0)
Hemoglobin: 12.1 g/dL — ABNORMAL LOW (ref 13.0–18.0)
MCH: 35.9 pg — AB (ref 26.0–34.0)
MCHC: 34.7 g/dL (ref 32.0–36.0)
MCV: 103.5 fL — ABNORMAL HIGH (ref 80.0–100.0)
Platelets: 179 10*3/uL (ref 150–440)
RBC: 3.36 MIL/uL — AB (ref 4.40–5.90)
RDW: 14 % (ref 11.5–14.5)
WBC: 7.5 10*3/uL (ref 3.8–10.6)

## 2016-05-26 LAB — SURGICAL PATHOLOGY

## 2016-05-26 MED ORDER — KETOROLAC TROMETHAMINE 30 MG/ML IJ SOLN
30.0000 mg | Freq: Four times a day (QID) | INTRAMUSCULAR | Status: AC
  Administered 2016-05-26 – 2016-05-31 (×19): 30 mg via INTRAVENOUS
  Filled 2016-05-26 (×19): qty 1

## 2016-05-26 MED ORDER — POTASSIUM CHLORIDE 10 MEQ/100ML IV SOLN
10.0000 meq | INTRAVENOUS | Status: AC
  Administered 2016-05-26 (×2): 10 meq via INTRAVENOUS
  Filled 2016-05-26 (×3): qty 100

## 2016-05-26 NOTE — Progress Notes (Signed)
MEDICATION RELATED CONSULT NOTE - Follow up   Pharmacy Consult for Electrolyte Supplementation Indication: hypokalemia  No Known Allergies  Patient Measurements: Height: 6\' 2"  (188 cm) Weight: 210 lb (95.255 kg) IBW/kg (Calculated) : 82.2   Vital Signs: Temp: 98.3 F (36.8 C) (06/19 0506) Temp Source: Oral (06/19 0506) BP: 136/71 mmHg (06/19 0506) Pulse Rate: 100 (06/19 0506) Intake/Output from previous day: 06/18 0701 - 06/19 0700 In: 3637.8 [I.V.:2970.8; IV Piggyback:457] Out: 2840 [id number]; Emesis/NG output:650; Drains:220] Intake/Output from this shift:    Labs:  Recent Labs  05/24/16 0543 05/25/16 0443 05/26/16 0420  WBC 9.2 6.6 7.5  HGB 12.1* 11.7* 12.1*  HCT 34.6* 33.8* 34.8*  PLT 169 173 179  CREATININE 0.91 0.92 0.83  MG 1.8 1.8 1.9  PHOS 2.8 3.2  --   ALBUMIN  --   --  2.2*  PROT  --   --  5.1*  AST  --   --  23  ALT  --   --  21  ALKPHOS  --   --  45  BILITOT  --   --  1.0   Estimated Creatinine Clearance: 118.3 mL/min (by C-G formula based on Cr of 0.83).   Assessment: Pharmacy consulted to supplement electrolytes in a 55 yo male admitted with diverticulitis.   K: 3.4, Mag: 1.9   Plan:  K remains low but improved at 3.4. MD has ordered 2 runs of KCL 10 MEQ IV. Will recheck all electrolytes in the AM.  Pharmacy will continue to follow.   Rayna Sexton, PharmD, BCPS Clinical Pharmacist 05/26/2016 7:46 AM

## 2016-05-26 NOTE — NC FL2 (Signed)
[place] LEVEL OF CARE SCREENING TOOL     IDENTIFICATION  Patient Name: Clayton Butler Birthdate: 1961/05/24 Sex: male Admission Date (Current Location): 05/20/2016  [place] and [place] Number:  Engineering geologist and Address:  [facility], [address] [place], [place] [address]      Provider Number: [id number]  Attending Physician Name and Address:  Jules Husbands, MD  Relative Name and Phone Number:       Current Level of Care: Hospital Recommended Level of Care:   Prior Approval Number:    Date Approved/Denied:   PASRR Number: [id number]  Discharge Plan: SNF    Current Diagnoses: Patient Active Problem List   Diagnosis Date Noted  . Diverticulitis of colon with perforation 05/20/2016    Orientation RESPIRATION BLADDER Height & Weight     Self, Time, Situation, Place  Normal Continent Weight: 210 lb (95.255 kg) Height:  6\' 2"  (188 cm)  BEHAVIORAL SYMPTOMS/MOOD NEUROLOGICAL BOWEL NUTRITION STATUS      Colostomy Diet (Clear Liquid Diet)  AMBULATORY STATUS COMMUNICATION OF NEEDS Skin   Limited Assist Verbally Normal                       Personal Care Assistance Level of Assistance  Bathing, Feeding, Dressing Bathing Assistance: Limited assistance Feeding assistance: Independent Dressing Assistance: Limited assistance     Functional Limitations Info  Sight, Hearing, Speech Sight Info: Adequate Hearing Info: Adequate Speech Info: Adequate    SPECIAL CARE FACTORS FREQUENCY  OT (By licensed OT)       OT Frequency: 5            Contractures      Additional Factors Info  Code Status, Allergies Code Status Info: Full Code Allergies Info: No known allergies           Current Medications (05/26/2016):  This is the current hospital active medication list Current Facility-Administered Medications  Medication Dose Route Frequency Provider Last Rate Last Dose  . calcium carbonate (TUMS - dosed  in mg elemental calcium) chewable tablet 200 mg of elemental calcium  1 tablet Oral Q4H PRN Mali Gonczy, MD   200 mg of elemental calcium at 05/24/16 0540  . dextrose 5 %-0.9 % sodium chloride infusion   Intravenous Continuous Hubbard Robinson, MD 50 mL/hr at 05/26/16 [id number]    . diphenhydrAMINE (BENADRYL) 12.5 MG/5ML elixir 12.5 mg  12.5 mg Oral Q6H PRN Diego F Pabon, MD       Or  . diphenhydrAMINE (BENADRYL) injection 12.5 mg  12.5 mg Intravenous Q6H PRN Jules Husbands, MD   12.5 mg at 05/20/16 2056  . enoxaparin (LOVENOX) injection 40 mg  40 mg Subcutaneous Q24H Jules Husbands, MD   40 mg at 05/26/16 1230  . famotidine (PEPCID) IVPB 20 mg premix  20 mg Intravenous Q12H Jules Husbands, MD   20 mg at 05/26/16 0831  . gabapentin (NEURONTIN) capsule 300 mg  300 mg Oral TID Jules Husbands, MD   300 mg at 05/26/16 0831  . HYDROmorphone (DILAUDID) injection 0.5 mg  0.5 mg Intravenous Q2H PRN Diego F Pabon, MD      . ketorolac (TORADOL) 30 MG/ML injection 30 mg  30 mg Intravenous Q6H Hubbard Robinson, MD   30 mg at 05/26/16 1230  . menthol-cetylpyridinium (CEPACOL) lozenge 3 mg  1 lozenge Oral PRN Diego F Dahlia Byes, MD      . nicotine (NICODERM CQ -  dosed in mg/24 hours) patch 14 mg  14 mg Transdermal Daily Jules Husbands, MD   14 mg at 05/26/16 0831  . ondansetron (ZOFRAN-ODT) disintegrating tablet 4 mg  4 mg Oral Q6H PRN Diego F Pabon, MD       Or  . ondansetron (ZOFRAN) injection 4 mg  4 mg Intravenous Q6H PRN Jules Husbands, MD   4 mg at 05/24/16 0017  . piperacillin-tazobactam (ZOSYN) IVPB 3.375 g  3.375 g Intravenous Q8H Diego F Pabon, MD 12.5 mL/hr at 05/26/16 1230 3.375 g at 05/26/16 1230     Discharge Medications: Please see discharge summary for a list of discharge medications.  Relevant Imaging Results:  Relevant Lab Results:   Additional Information SSN:  [id number]  Darden Dates, LCSW

## 2016-05-26 NOTE — Consult Note (Signed)
WOC ostomy consult note Stoma type/location: LLQ end colostomy, s/p Hartmann's procedure.  Teaching and pouch change today.  Stomal assessment/size: 1 1/4" round stoma, slightly budded, dusky. Light and digital exam revealed healthy pink stomal tissue.  Brown stool tinged liquid present in pouch.  Explained that we will watch the stoma and anticipate that the dusky tissue will slough off revealing healthy pink stomal tissue.  Patient had questions regarding diverticulitis and why he became so ill.  This is explained to him.  Emotional support given that infection is being treated currently.   Peristomal assessment: Intact.  Drains in place, stoma is dusky and slightly budded.  Os at center.  Treatment options for stomal/peristomal skin: Barrier ring.   Output Stool tinged liquid Ostomy pouching: 2pc. 2 1/4" pouch  Education provided: Pouch change provided and rationale for barrier ring explained.  Stoma measured and barrier cut to fit.  Discussed changing pouch twice weekly and emptying several times daily when 1/3 full.  Verbalized understanding.  Explained that we would be monitoring dusky stoma.  Significant other at bedside that will be helping after discharge.  Recommend HH for ongoing education and wound care.   Enrolled patient in [place] program: no WOC team will monitor and remain available to patient, medical and nursing teams.  Domenic Moras RN BSN [place] Pager [phone]

## 2016-05-26 NOTE — Progress Notes (Signed)
Physical Therapy Treatment Patient Details Name: Clayton Butler MRN: [id number] DOB: Nov 10, 1961 Today's Date: 05/26/2016    History of Present Illness 55 yo male with onset of abd pain and  discovery of diverticulitis with perforation.  On NG tube with suction, GI doctor notes ileus and has NPO.  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      PT Comments    Pt in bed, notes just returned to bed from up in chair for hours. Pt states he ambulated with nursing about nurses station on 2 separate occasions today. Pt agreeable to supine exercises. Pt participates well; discussed stand exercises as well. Able to tolerate basic abdominal isolation exercise. Plan to see pt earlier next session to allow for ambulation and stair climbing. Discussed change in discharge disposition to home post hospital stay with SW. Continue PT to progress all strength, endurance and functional mobility.   Follow Up Recommendations  No PT follow up;Supervision - Intermittent (plans to have a helper at home)     Equipment Recommendations  None recommended by PT    Recommendations for Other Services       Precautions / Restrictions Precautions Precautions: Fall Restrictions Weight Bearing Restrictions: No    Mobility  Bed Mobility               General bed mobility comments: Just returned to bed; wished to stay in bed.   Transfers                    Ambulation/Gait                 Stairs            Wheelchair Mobility    Modified Rankin (Stroke Patients Only)       Balance                                    Cognition Arousal/Alertness: Awake/alert (Fatigued)                          Exercises General Exercises - Lower Extremity Ankle Circles/Pumps: AROM;Both;20 reps;Supine Quad Sets: Strengthening;Both;20 reps;Supine Gluteal Sets: Strengthening;Both;20 reps;Supine Short Arc Quad: AROM;Both;20  reps;Supine Heel Slides: Strengthening;Both;20 reps;Supine (resisted ext) Hip ABduction/ADduction: AROM;Both;20 reps;Supine Straight Leg Raises: AAROM;Both;10 reps;Supine Other Exercises Other Exercises: Verbally discussed stand exercises to initiate next session Other Exercises: Diecussed equipment needs Other Exercises: Abdominal isolation 10x    General Comments        Pertinent Vitals/Pain Pain Assessment: No/denies pain    Home Living                      Prior Function            PT Goals (current goals can now be found in the care plan section) Progress towards PT goals: Progressing toward goals    Frequency  Min 2X/week    PT Plan Discharge plan needs to be updated    Co-evaluation             End of Session   Activity Tolerance: Patient tolerated treatment well Patient left: in bed;with call bell/phone within reach;with bed alarm set     Time: [id number] PT Time Calculation (min) (ACUTE ONLY): 25 min  Charges:  $Therapeutic Exercise: 23-37 mins  G CodesCharlaine Dalton, [place] 05/26/2016, 4:09 PM

## 2016-05-26 NOTE — Progress Notes (Signed)
CSW was consulted stating patient would need SNF placement. CSW spoke to PT Heidi. She reports that her recommendation for SNF has been changed to [facility]. CSW informed RNCM of above. CSW is signing off and is available if a CSW need were to arise.   Ernest Pine, MSW, [place] Social Work Department [phone]

## 2016-05-26 NOTE — Progress Notes (Signed)
55 yr old POD#4 from Hartman's procedure for perforated diverticulitis with purulent peritonitis.  Patient doing well today, had NG tube d/c'd yesterday.  He took in some ice chips and sips of water without any nausea.  He has been up in the room and sat in chair but did not see PT yesterday.  He states that he still feels a little unsteady on his feet.  He states pain well controlled.    Filed Vitals:   05/25/16 2036 05/26/16 0506  BP: 130/79 136/71  Pulse: 92 100  Temp: 98.6 F (37 C) 98.3 F (36.8 C)  Resp: 18    I/O last 3 completed shifts: In: 5131.4 [I.V.:4414.4; Other:210; IV [id number] Out: [id number] [Urine:2070; Emesis/NG output:1690; Drains:490]     PE:  Gen: NAD [id number], appropriately tender, incision open with wet to dry dressings, good granulation tissue, no erythema or purulence, JP drains x4 with minimal serous drainage.  Ext: 2+ pulses, no edema  CBC Latest Ref Rng 05/26/2016 05/25/2016 05/24/2016  WBC 3.8 - 10.6 K/uL 7.5 6.6 9.2  Hemoglobin 13.0 - 18.0 g/dL 12.1(L) 11.7(L) 12.1(L)  Hematocrit 40.0 - 52.0 % 34.8(L) 33.8(L) 34.6(L)  Platelets 150 - 440 K/uL 179 173 169    CMP Latest Ref Rng 05/26/2016 05/25/2016 05/25/2016  Glucose 65 - 99 mg/dL 107(H) - 107(H)  BUN 6 - 20 mg/dL 12 - 18  Creatinine 0.61 - 1.24 mg/dL 0.83 - 0.92  Sodium 135 - 145 mmol/L 138 - 140  Potassium 3.5 - 5.1 mmol/L 3.4(L) 3.3(L) 3.2(L)  Chloride 101 - 111 mmol/L 106 - 107  CO2 22 - 32 mmol/L 26 - 29  Calcium 8.9 - 10.3 mg/dL 7.6(L) - 7.7(L)  Total Protein 6.5 - 8.1 g/dL 5.1(L) - -  Total Bilirubin 0.3 - 1.2 mg/dL 1.0 - -  Alkaline Phos 38 - 126 U/L 45 - -  AST 15 - 41 U/L 23 - -  ALT 17 - 63 U/L 21 - -     A/P: 55 yr old POD#4 from Hartman's procedure for perforated diverticulitis with purulent peritonitis.  Pain: neurotin and toradol  Gi: clear liquids today, ostomy nurse to come for teaching today, continue wet to dry dressings BID, will monitor jp drain output and d/c as advance  diet, continue zosyn for purulent peritonitis  GU: voiding well, decrease IV fluids.  Encourage ambulation, PT to evaluate today SW: will need Home health nursing to help with new ostomy, jp drains and monitor dressing changes, anticipate d/c this week.

## 2016-05-27 ENCOUNTER — Inpatient Hospital Stay: Payer: BLUE CROSS/BLUE SHIELD | Admitting: Anesthesiology

## 2016-05-27 ENCOUNTER — Encounter
Admission: EM | Disposition: A | Discharge status: Home Health | Payer: Self-pay | Source: Home / Self Care | Attending: Surgery

## 2016-05-27 ENCOUNTER — Encounter: Payer: Self-pay | Admitting: Anesthesiology

## 2016-05-27 DIAGNOSIS — K9409 Other complications of colostomy: Secondary | ICD-10-CM

## 2016-05-27 DIAGNOSIS — T8131XA Disruption of external operation (surgical) wound, not elsewhere classified, initial encounter: Secondary | ICD-10-CM

## 2016-05-27 HISTORY — PX: LAPAROTOMY: SHX154

## 2016-05-27 LAB — BASIC METABOLIC PANEL
ANION GAP: 3 — AB (ref 5–15)
BUN: 10 mg/dL (ref 6–20)
CALCIUM: 7.8 mg/dL — AB (ref 8.9–10.3)
CO2: 28 mmol/L (ref 22–32)
Chloride: 106 mmol/L (ref 101–111)
Creatinine, Ser: 0.89 mg/dL (ref 0.61–1.24)
GLUCOSE: 110 mg/dL — AB (ref 65–99)
POTASSIUM: 3.6 mmol/L (ref 3.5–5.1)
Sodium: 137 mmol/L (ref 135–145)

## 2016-05-27 LAB — PHOSPHORUS: PHOSPHORUS: 3.6 mg/dL (ref 2.5–4.6)

## 2016-05-27 LAB — MAGNESIUM: MAGNESIUM: 1.9 mg/dL (ref 1.7–2.4)

## 2016-05-27 SURGERY — LAPAROTOMY, EXPLORATORY
Anesthesia: General | Site: Abdomen | Wound class: Clean

## 2016-05-27 MED ORDER — ONDANSETRON HCL 4 MG/2ML IJ SOLN
INTRAMUSCULAR | Status: DC | PRN
  Administered 2016-05-27: 4 mg via INTRAVENOUS

## 2016-05-27 MED ORDER — BUPIVACAINE LIPOSOME 1.3 % IJ SUSP
INTRAMUSCULAR | Status: DC | PRN
  Administered 2016-05-27: 18:00:00

## 2016-05-27 MED ORDER — PHENYLEPHRINE HCL 10 MG/ML IJ SOLN
INTRAMUSCULAR | Status: DC | PRN
  Administered 2016-05-27 (×2): 100 ug via INTRAVENOUS

## 2016-05-27 MED ORDER — FENTANYL CITRATE (PF) 100 MCG/2ML IJ SOLN
INTRAMUSCULAR | Status: AC
  Administered 2016-05-27: 50 ug via INTRAVENOUS
  Filled 2016-05-27: qty 2

## 2016-05-27 MED ORDER — BUPIVACAINE LIPOSOME 1.3 % IJ SUSP
INTRAMUSCULAR | Status: AC
  Filled 2016-05-27: qty 20

## 2016-05-27 MED ORDER — BUPIVACAINE HCL (PF) 0.5 % IJ SOLN
INTRAMUSCULAR | Status: AC
  Filled 2016-05-27: qty 30

## 2016-05-27 MED ORDER — FENTANYL CITRATE (PF) 100 MCG/2ML IJ SOLN
25.0000 ug | INTRAMUSCULAR | Status: DC | PRN
  Administered 2016-05-27 (×2): 50 ug via INTRAVENOUS

## 2016-05-27 MED ORDER — LACTATED RINGERS IV SOLN
INTRAVENOUS | Status: DC | PRN
  Administered 2016-05-27 (×2): via INTRAVENOUS

## 2016-05-27 MED ORDER — MIDAZOLAM HCL 2 MG/2ML IJ SOLN
INTRAMUSCULAR | Status: DC | PRN
  Administered 2016-05-27: 2 mg via INTRAVENOUS

## 2016-05-27 MED ORDER — DEXAMETHASONE SODIUM PHOSPHATE 10 MG/ML IJ SOLN
INTRAMUSCULAR | Status: DC | PRN
  Administered 2016-05-27: 5 mg via INTRAVENOUS

## 2016-05-27 MED ORDER — FENTANYL CITRATE (PF) 100 MCG/2ML IJ SOLN
INTRAMUSCULAR | Status: DC | PRN
  Administered 2016-05-27: 50 ug via INTRAVENOUS
  Administered 2016-05-27: 100 ug via INTRAVENOUS
  Administered 2016-05-27 (×2): 50 ug via INTRAVENOUS

## 2016-05-27 MED ORDER — ROCURONIUM BROMIDE 100 MG/10ML IV SOLN
INTRAVENOUS | Status: DC | PRN
  Administered 2016-05-27: 50 mg via INTRAVENOUS
  Administered 2016-05-27: 5 mg via INTRAVENOUS
  Administered 2016-05-27: 20 mg via INTRAVENOUS

## 2016-05-27 MED ORDER — ACETAMINOPHEN 10 MG/ML IV SOLN
INTRAVENOUS | Status: DC | PRN
  Administered 2016-05-27: 1000 mg via INTRAVENOUS

## 2016-05-27 MED ORDER — SODIUM CHLORIDE 0.9 % IJ SOLN
INTRAMUSCULAR | Status: AC
  Filled 2016-05-27: qty 50

## 2016-05-27 MED ORDER — OXYCODONE-ACETAMINOPHEN 5-325 MG PO TABS
1.0000 | ORAL_TABLET | ORAL | Status: DC | PRN
  Administered 2016-05-27 – 2016-05-30 (×2): 1 via ORAL
  Administered 2016-05-31 (×3): 2 via ORAL
  Administered 2016-06-01: 1 via ORAL
  Administered 2016-06-01: 2 via ORAL
  Filled 2016-05-27: qty 2
  Filled 2016-05-27 (×2): qty 1
  Filled 2016-05-27 (×2): qty 2
  Filled 2016-05-27: qty 1
  Filled 2016-05-27: qty 2

## 2016-05-27 MED ORDER — ONDANSETRON HCL 4 MG/2ML IJ SOLN: 4.0000 mg | Freq: Once | INTRAMUSCULAR | Status: DC | PRN

## 2016-05-27 MED ORDER — PROPOFOL 10 MG/ML IV BOLUS
INTRAVENOUS | Status: DC | PRN
  Administered 2016-05-27: 160 mg via INTRAVENOUS

## 2016-05-27 MED ORDER — SUGAMMADEX SODIUM 200 MG/2ML IV SOLN
INTRAVENOUS | Status: DC | PRN
  Administered 2016-05-27: 200 mg via INTRAVENOUS

## 2016-05-27 MED ORDER — ACETAMINOPHEN 10 MG/ML IV SOLN
INTRAVENOUS | Status: AC
  Filled 2016-05-27: qty 100

## 2016-05-27 MED ORDER — LIDOCAINE HCL (CARDIAC) 20 MG/ML IV SOLN
INTRAVENOUS | Status: DC | PRN
  Administered 2016-05-27: 100 mg via INTRAVENOUS

## 2016-05-27 SURGICAL SUPPLY — 36 items
BARRIER SKIN 2 1/4 (WOUND CARE) ×2 IMPLANT
BARRIER SKIN 2 1/4INCH (WOUND CARE) ×1
CANISTER SUCT 1200ML W/VALVE (MISCELLANEOUS) ×3 IMPLANT
CATH TRAY 16F METER LATEX (MISCELLANEOUS) IMPLANT
CHLORAPREP W/TINT 26ML (MISCELLANEOUS) ×3 IMPLANT
DRAPE LAPAROTOMY 100X77 ABD (DRAPES) ×3 IMPLANT
DRSG OPSITE POSTOP 4X14 (GAUZE/BANDAGES/DRESSINGS) ×3 IMPLANT
DRSG TEGADERM 4X4.75 (GAUZE/BANDAGES/DRESSINGS) ×3 IMPLANT
ELECT CAUTERY BLADE 6.4 (BLADE) ×3 IMPLANT
ELECT REM PT RETURN 9FT ADLT (ELECTROSURGICAL) ×3
ELECTRODE REM PT RTRN 9FT ADLT (ELECTROSURGICAL) ×1 IMPLANT
GAUZE SPONGE 4X4 12PLY STRL (GAUZE/BANDAGES/DRESSINGS) ×3 IMPLANT
GLOVE BIO SURGEON STRL SZ 6.5 (GLOVE) ×2 IMPLANT
GLOVE BIO SURGEON STRL SZ7 (GLOVE) ×3 IMPLANT
GLOVE BIO SURGEONS STRL SZ 6.5 (GLOVE) ×1
GLOVE BIOGEL PI IND STRL 7.0 (GLOVE) ×1 IMPLANT
GLOVE BIOGEL PI INDICATOR 7.0 (GLOVE) ×2
GLOVE PI ORTHOPRO 6.5 (GLOVE) ×6
GLOVE PI ORTHOPRO STRL 6.5 (GLOVE) ×3 IMPLANT
GOWN STRL REUS W/ TWL LRG LVL3 (GOWN DISPOSABLE) ×3 IMPLANT
GOWN STRL REUS W/TWL LRG LVL3 (GOWN DISPOSABLE) ×6
KIT RM TURNOVER STRD PROC AR (KITS) ×3 IMPLANT
LABEL OR SOLS (LABEL) ×3 IMPLANT
LIGASURE IMPACT 36 18CM CVD LR (INSTRUMENTS) IMPLANT
NS IRRIG 1000ML POUR BTL (IV SOLUTION) ×9 IMPLANT
PACK BASIN MAJOR ARMC (MISCELLANEOUS) ×3 IMPLANT
PACK COLON CLEAN CLOSURE (MISCELLANEOUS) ×3 IMPLANT
STAPLER PROXIMATE 55 BLUE (STAPLE) ×3 IMPLANT
STAPLER SKIN PROX 35W (STAPLE) ×3 IMPLANT
SUT PDS #1 CTX NDL (SUTURE) ×6 IMPLANT
SUT PDS AB 1 TP1 96 (SUTURE) ×6 IMPLANT
SUT SILK 3-0 (SUTURE) IMPLANT
SUT VIC AB 0 CT1 18XCR BRD 8 (SUTURE) ×1 IMPLANT
SUT VIC AB 0 CT1 8-18 (SUTURE) ×2
SUT VICRYL+ 3-0 144IN (SUTURE) ×3 IMPLANT
VICRY 0 ×6 IMPLANT

## 2016-05-27 NOTE — Progress Notes (Signed)
55 yr old POD#5 from Hartman's procedure for perforated diverticulitis with purulent peritonitis.  Patient doing well today.  He had clears and tolerated them well, he had air and stool in ostomy bag today.  He denies any pain this AM. He worked with PT yesterday and took two laps in the hallway.  He also worked with the ostomy nurse yesterday and is feeling more comfortable.   Filed Vitals:   05/27/16 0527 05/27/16 1154  BP: 138/82 141/85  Pulse: 93 98  Temp: 98.9 F (37.2 C) 98.8 F (37.1 C)  Resp: 18 18   I/O last 3 completed shifts: In: 5830.3 [P.O.:1420; I.V.:3471.3; Other:385; IV [id number] Out: 3602.5 [Urine:3400; Drains:202.5] Total I/O In: 360 [P.O.:360] Out: 325 [Urine:325]   PE:  Gen: NAD [id number], appropriately tender, incision open with wet to dry dressings, good granulation tissue, no erythema or purulence, JP drains x4 with minimal serous drainage.  Ext: 2+ pulses, no edema  CBC Latest Ref Rng 05/26/2016 05/25/2016 05/24/2016  WBC 3.8 - 10.6 K/uL 7.5 6.6 9.2  Hemoglobin 13.0 - 18.0 g/dL 12.1(L) 11.7(L) 12.1(L)  Hematocrit 40.0 - 52.0 % 34.8(L) 33.8(L) 34.6(L)  Platelets 150 - 440 K/uL 179 173 169    CMP Latest Ref Rng 05/27/2016 05/26/2016 05/25/2016  Glucose 65 - 99 mg/dL 110(H) 107(H) -  BUN 6 - 20 mg/dL 10 12 -  Creatinine 0.61 - 1.24 mg/dL 0.89 0.83 -  Sodium 135 - 145 mmol/L 137 138 -  Potassium 3.5 - 5.1 mmol/L 3.6 3.4(L) 3.3(L)  Chloride 101 - 111 mmol/L 106 106 -  CO2 22 - 32 mmol/L 28 26 -  Calcium 8.9 - 10.3 mg/dL 7.8(L) 7.6(L) -  Total Protein 6.5 - 8.1 g/dL - 5.1(L) -  Total Bilirubin 0.3 - 1.2 mg/dL - 1.0 -  Alkaline Phos 38 - 126 U/L - 45 -  AST 15 - 41 U/L - 23 -  ALT 17 - 63 U/L - 21 -     A/P: 55 yr old POD#5 from Hartman's procedure for perforated diverticulitis with purulent peritonitis.  Pain: neurotin and toradol  [id number] to regular diet today, ostomy nurse to do more teaching today, continue wet to dry dressings BID, will monitor  jp drain output and likely will d/c two tomorrow, continue zosyn for purulent peritonitis  GU: voiding well, decrease IV fluids.  Encourage ambulation, PT to evaluate today SW: will need Home health nursing to help with new ostomy, jp drains and monitor dressing changes, anticipate d/c in next couple days

## 2016-05-27 NOTE — Progress Notes (Signed)
55yr old POD#5 from Hartman's procedure for perforated diverticulitis with purulent peritonitis.  I was called to the patient' room because of bulging from wound and severe back pain. Patient stated that he got up to the chair and started having severe pain and felt a bulge in the abdomen.  He was placed back in bed and the nurse inspected the wound.  I examined the patient and he had two large loops of small bowel protruding through a 5cm opening in the incision inferior to the umbilicus.  The bowel was placed back inside as much as possible and saline soaked ABD pads placed on top of the incision.  I discussed with the patient that he will need to go to the operating room immediately for the bowel being outside the abdomen.  I discussed that the plan would be to reopen his abdomen, wash the abdomen out and inspect and then reclose the abdomen.  I discussed the risks, benefits, alternatives and expected outcomes including bleeding, infection, damage to bowel, need to resect bowel, need for transfusion and anesthetic risks of heart attack, stroke, blood clots and death.  The patient was given the opportunity to ask questions and have them answered.  He is in agreement to proceed to the OR for Exploratory Laparotomy, informed consent obtained.

## 2016-05-27 NOTE — Progress Notes (Signed)
PT Cancellation Note  Patient Details Name: Clayton Butler MRN: [id number] DOB: [birth year]   Cancelled Treatment:    Reason Eval/Treat Not Completed: Medical issues which prohibited therapy;Pain limiting ability to participate. Pt up in chair; is is noted that pt has large blood stained area on blanket/gown. Nursing contacted who notes drainage from wound site and will address. Pt also notes he has 8/10 pain in back and is requesting pain medications. Pt deferred at this time. Will check back with pt this afternoon as time and schedule allows.    Charlaine Dalton, [place] 05/27/2016, 11:54 AM

## 2016-05-27 NOTE — Brief Op Note (Signed)
05/20/2016 - 05/27/2016  7:27 PM  PATIENT:  Clayton Butler  55 y.o. male  PRE-OPERATIVE DIAGNOSIS:  dehesance  POST-OPERATIVE DIAGNOSIS:  dehesance  PROCEDURE:  Procedure(s): EXPLORATORY LAPAROTOMY (N/A) revision of ostomy  SURGEON:  Surgeon(s) and Role:    * Hubbard Robinson, MD - Primary  PHYSICIAN ASSISTANT:   ASSISTANTS: none   ANESTHESIA:   general  EBL:  Total I/O In: 600 [I.V.:600] Out: 170 [Drains:70; Blood:100]  BLOOD ADMINISTERED:none  DRAINS: (2) Jackson-Pratt drain(s) with closed bulb suction in the pelvis   LOCAL MEDICATIONS USED:  MARCAINE     SPECIMEN:  No Specimen  DISPOSITION OF SPECIMEN:  PATHOLOGY  COUNTS:  YES  TOURNIQUET:  * No tourniquets in log *  DICTATION: .Dragon Dictation  PLAN OF CARE: Admit to inpatient   PATIENT DISPOSITION:  PACU - hemodynamically stable.   Delay start of Pharmacological VTE agent (>24hrs) due to surgical blood loss or risk of bleeding: not applicable

## 2016-05-27 NOTE — Anesthesia Procedure Notes (Signed)
Procedure Name: Intubation Date/Time: 05/27/2016 4:21 PM Performed by: Doreen Salvage Pre-anesthesia Checklist: Patient identified, Patient being monitored, Timeout performed, Emergency Drugs available and Suction available Patient Re-evaluated:Patient Re-evaluated prior to inductionOxygen Delivery Method: Circle system utilized Preoxygenation: Pre-oxygenation with 100% oxygen Intubation Type: IV induction Ventilation: Mask ventilation without difficulty Laryngoscope Size: Mac and 3 Grade View: Grade I Tube type: Oral Tube size: 7.5 mm Number of attempts: 1 Airway Equipment and Method: Stylet Placement Confirmation: ETT inserted through vocal cords under direct vision,  positive ETCO2 and breath sounds checked- equal and bilateral Secured at: 22 cm Tube secured with: Tape Dental Injury: Teeth and Oropharynx as per pre-operative assessment

## 2016-05-27 NOTE — Anesthesia Preprocedure Evaluation (Signed)
Anesthesia Evaluation  Patient identified by MRN, date of birth, ID band Patient awake    Reviewed: Allergy & Precautions, H&P , NPO status , Patient's Chart, lab work & pertinent test results, reviewed documented beta blocker date and time   History of Anesthesia Complications Negative for: history of anesthetic complications  Airway Mallampati: II  TM Distance: >3 FB Neck ROM: full    Dental no notable dental hx. (+) Edentulous Upper, Upper Dentures, Missing, Poor Dentition   Pulmonary neg shortness of breath, neg sleep apnea, neg COPD, neg recent URI, Current Smoker,    Pulmonary exam normal breath sounds clear to auscultation       Cardiovascular Exercise Tolerance: Good negative cardio ROS Normal cardiovascular exam Rhythm:regular Rate:Normal     Neuro/Psych negative neurological ROS  negative psych ROS   GI/Hepatic negative GI ROS, Neg liver ROS,   Endo/Other  negative endocrine ROS  Renal/GU negative Renal ROS  negative genitourinary   Musculoskeletal   Abdominal   Peds  Hematology negative hematology ROS (+)   Anesthesia Other Findings History reviewed. No pertinent past medical history.   Reproductive/Obstetrics negative OB ROS                             Anesthesia Physical  Anesthesia Plan  ASA: II  Anesthesia Plan: General   Post-op Pain Management:    Induction:   Airway Management Planned:   Additional Equipment:   Intra-op Plan:   Post-operative Plan:   Informed Consent: I have reviewed the patients History and Physical, chart, labs and discussed the procedure including the risks, benefits and alternatives for the proposed anesthesia with the patient or authorized representative who has indicated his/her understanding and acceptance.   Dental Advisory Given  Plan Discussed with: Anesthesiologist, CRNA and Surgeon  Anesthesia Plan Comments:          Anesthesia Quick Evaluation

## 2016-05-27 NOTE — Progress Notes (Deleted)
20 ml of bright red emesis noted. No pain or discomfort. Dr Anselm Jungling notified.

## 2016-05-27 NOTE — Progress Notes (Signed)
Pt was complaining of back pain. Abdominal wound assessed, covered with ABD pads with serosanguenous dranage, noted to be bulging. Dr Azalee Course notified and assessed wound.

## 2016-05-27 NOTE — Progress Notes (Signed)
MEDICATION RELATED CONSULT NOTE - Follow up   Pharmacy Consult for Electrolyte Supplementation Indication: hypokalemia  No Known Allergies  Patient Measurements: Height: 6\' 2"  (188 cm) Weight: 210 lb (95.255 kg) IBW/kg (Calculated) : 82.2   Vital Signs: Temp: 98.9 F (37.2 C) (06/20 0527) Temp Source: Oral (06/20 0527) BP: 138/82 mmHg (06/20 0527) Pulse Rate: 93 (06/20 0527) Intake/Output from previous day: 06/19 0701 - 06/20 0700 In: 3653.6 [P.O.:1420; I.V.:1851.6; IV Piggyback:97] Out: 2007.5 [id number]; Drains:57.5] Intake/Output from this shift:    Labs:  Recent Labs  05/25/16 0443 05/26/16 0420 05/27/16 0459  WBC 6.6 7.5  --   HGB 11.7* 12.1*  --   HCT 33.8* 34.8*  --   PLT 173 179  --   CREATININE 0.92 0.83 0.89  MG 1.8 1.9 1.9  PHOS 3.2  --  3.6  ALBUMIN  --  2.2*  --   PROT  --  5.1*  --   AST  --  23  --   ALT  --  21  --   ALKPHOS  --  45  --   BILITOT  --  1.0  --    Estimated Creatinine Clearance: 110.3 mL/min (by C-G formula based on Cr of 0.89).   Assessment: Pharmacy consulted to supplement electrolytes in a 55 yo male admitted with diverticulitis.   K: 3.6, Mag: 1.9, Phos 3.6   Plan:  No supplementation needed at this time. Will recheck labs in the AM.  Pharmacy will continue to follow.   Rayna Sexton, PharmD, BCPS Clinical Pharmacist 05/27/2016 7:44 AM

## 2016-05-27 NOTE — Care Management Note (Signed)
Case Management Note  Patient Details  Name: Clayton Butler MRN: [id number] Date of Birth: January 03, 1961  Subjective/Objective:                 Patient admitted with perforated diverticulitis with purulent peritonitis.  Patient lives at home alone.  Patient states that after discharge he will have a family friend staying with him to assist with providing care, and transportation. PT has assessed patient and no follow up or equipment recommended.  Patient currently has 4 JP drains, new ostomy, and open wound.  Patient will require  Home health nursing at discharge.  Patient was provided with agency list.   Patient does not have preference.  [facility]    Action/Plan: RNCM following for discharge planning  Expected Discharge Date:                  Expected Discharge Plan:     In-House Referral:     Discharge planning Services     Post Acute Care Choice:    Choice offered to:     DME Arranged:    DME Agency:     HH Arranged:    HH Agency:     Status of Service:     If discussed at [employer], dates discussed:    Additional Comments:  Beverly Sessions, RN 05/27/2016, 3:15 PM

## 2016-05-27 NOTE — Progress Notes (Signed)
Initial Nutrition Assessment  DOCUMENTATION CODES:   Not applicable  INTERVENTION:  -Monitor intake and diet progression -Discussed Colostomy Nutrition Therapy with pt.  Handout provided and questions addressed.  Expect good compliance.     NUTRITION DIAGNOSIS:   Inadequate oral intake related to altered GI function as evidenced by  (NPO/clear liquids for past 7 days).    GOAL:   Patient will meet greater than or equal to 90% of their needs    MONITOR:   PO intake, Diet advancement  REASON FOR ASSESSMENT:   NPO/Clear Liquid Diet    ASSESSMENT:      Pt s/p LLQ end colostomy with Hartmann procedure secondary to perforated diverticulitis with purulent peritonitis.    History reviewed. No pertinent past medical history.  Pt reports normal intake prior to admission. Tolerating liquids at this time.    Medications reviewed: D5 NS at 67ml/hr Labs reviewed:   Diet Order:  Diet regular Room service appropriate?: Yes; Fluid consistency:: Thin  Skin:  Reviewed, no issues  Last BM:  20ml output noted  Height:   Ht Readings from Last 1 Encounters:  05/20/16 6\' 2"  (1.88 m)    Weight:   Wt Readings from Last 1 Encounters:  05/20/16 210 lb (95.255 kg)    Ideal Body Weight:     BMI:  Body mass index is 26.95 kg/(m^2).  Estimated Nutritional Needs:   Kcal:  [id number] kcals/d  Protein:  114-142 g/d  Fluid:  >/= 2L/d  EDUCATION NEEDS:   Education needs addressed  Derian Pfost B. Zenia Resides, [place], [place] (pager) Weekend/On-Call pager [phone])

## 2016-05-27 NOTE — Interval H&P Note (Signed)
History and Physical Interval Note:  05/27/2016 3:34 PM  Clayton Butler  has presented today for surgery, with the diagnosis of * No pre-op diagnosis entered *  The various methods of treatment have been discussed with the patient and family. After consideration of risks, benefits and other options for treatment, the patient has consented to  Procedure(s): EXPLORATORY LAPAROTOMY (N/A) COLECTOMY WITH COLOSTOMY CREATION/HARTMANN PROCEDURE (N/A) as a surgical intervention .  The patient's history has been reviewed, patient examined, no change in status, stable for surgery.  I have reviewed the patient's chart and labs.  Questions were answered to the patient's satisfaction.     Kagen Kunath L Ethell Blatchford

## 2016-05-27 NOTE — Progress Notes (Signed)
PT Cancellation Note  Patient Details Name: Clayton Butler MRN: [id number] DOB: 12-Oct-1961   Cancelled Treatment:    Reason Eval/Treat Not Completed: Pain limiting ability to participate. Treatment attempted again as promised. Pt notes pain continues to be severe despite a couple of medications. Pt unable to participate in PT. Re attempt treatment at a later date.    Charlaine Dalton, [place] 05/27/2016, 2:29 PM

## 2016-05-27 NOTE — Transfer of Care (Signed)
Immediate Anesthesia Transfer of Care Note  Patient: Clayton Butler  Procedure(s) Performed: Procedure(s): EXPLORATORY LAPAROTOMY (N/A)  Patient Location: PACU  Anesthesia Type:General  Level of Consciousness: awake, alert  and oriented  Airway & Oxygen Therapy: Patient connected to face mask oxygen  Post-op Assessment: Post -op Vital signs reviewed and stable  Post vital signs: stable  Last Vitals:  Filed Vitals:   05/27/16 1154 05/27/16 1910  BP: 141/85 147/89  Pulse: 98 114  Temp: 37.1 C   Resp: 18 22    Last Pain:  Filed Vitals:   05/27/16 1911  PainSc: 8       Patients Stated Pain Goal: 0 ([id number])  Complications: No apparent anesthesia complications

## 2016-05-28 ENCOUNTER — Encounter: Payer: Self-pay | Admitting: Surgery

## 2016-05-28 LAB — BASIC METABOLIC PANEL
Anion gap: 5 (ref 5–15)
BUN: 10 mg/dL (ref 6–20)
CALCIUM: 7.8 mg/dL — AB (ref 8.9–10.3)
CHLORIDE: 104 mmol/L (ref 101–111)
CO2: 26 mmol/L (ref 22–32)
CREATININE: 0.82 mg/dL (ref 0.61–1.24)
GFR calc non Af Amer: >60 mL/min (ref >60)
Glucose, Bld: 130 mg/dL — ABNORMAL HIGH (ref 65–99)
Potassium: 3.9 mmol/L (ref 3.5–5.1)
SODIUM: 135 mmol/L (ref 135–145)

## 2016-05-28 MED ORDER — ALUM & MAG HYDROXIDE-SIMETH 200-200-20 MG/5ML PO SUSP
15.0000 mL | ORAL | Status: DC | PRN
  Administered 2016-05-28: 15 mL via ORAL

## 2016-05-28 NOTE — Anesthesia Postprocedure Evaluation (Signed)
Anesthesia Post Note  Patient: Clayton Butler  Procedure(s) Performed: Procedure(s) (LRB): EXPLORATORY LAPAROTOMY (N/A)  Patient location during evaluation: PACU Anesthesia Type: General Level of consciousness: awake and alert Pain management: pain level controlled Vital Signs Assessment: post-procedure vital signs reviewed and stable Respiratory status: spontaneous breathing, nonlabored ventilation, respiratory function stable and patient connected to nasal cannula oxygen Cardiovascular status: blood pressure returned to baseline and stable Postop Assessment: no signs of nausea or vomiting Anesthetic complications: no    Last Vitals:  Filed Vitals:   05/28/16 0042 05/28/16 0502  BP:  120/72  Pulse: 101   Temp: 36.9 C 36.8 C  Resp: 20 20    Last Pain:  Filed Vitals:   05/28/16 0604  PainSc: Asleep                 Martha Clan

## 2016-05-28 NOTE — Progress Notes (Signed)
MEDICATION RELATED CONSULT NOTE - Follow up   Pharmacy Consult for Electrolyte Supplementation Indication: hypokalemia  Allergies  Allergen Reactions  . Morphine And Related Other (See Comments)    "I passed out"    Patient Measurements: Height: 6\' 2"  (188 cm) Weight: 210 lb (95.255 kg) IBW/kg (Calculated) : 82.2   Vital Signs: Temp: 98.3 F (36.8 C) (06/21 0502) Temp Source: Oral (06/21 0502) BP: 120/72 mmHg (06/21 0502) Pulse Rate: 101 (06/21 0042) Intake/Output from previous day: 06/20 0701 - 06/21 0700 In: 3176.2 [P.O.:480; I.V.:2603.9; IV Piggyback:92.4] Out: 1825 [Urine:1575; Drains:150; Blood:100] Intake/Output from this shift:    Labs:  Recent Labs  05/26/16 0420 05/27/16 0459 05/28/16 0509  WBC 7.5  --   --   HGB 12.1*  --   --   HCT 34.8*  --   --   PLT 179  --   --   CREATININE 0.83 0.89 0.82  MG 1.9 1.9  --   PHOS  --  3.6  --   ALBUMIN 2.2*  --   --   PROT 5.1*  --   --   AST 23  --   --   ALT 21  --   --   ALKPHOS 45  --   --   BILITOT 1.0  --   --    Estimated Creatinine Clearance: 119.7 mL/min (by C-G formula based on Cr of 0.82).   Assessment: Pharmacy consulted to supplement electrolytes in a 55 yo male admitted with diverticulitis.   K: 3.9, Mag: 1.9, Phos 3.6   Plan:  No supplementation needed at this time. Will recheck BMP in the AM.  Pharmacy will continue to follow.   Ramond Dial, Pharm.D Clinical Pharmacist   05/28/2016 7:28 AM

## 2016-05-28 NOTE — Progress Notes (Signed)
55 yr old POD#6 from Hartman's procedure for perforated diverticulitis with purulent peritonitis POD#1 from Dehiscence with re-Exploratory laparotomy and closure.   Patient doing well today.  He is having more heart burn today and feels very tired.  States some soreness in the abdomen but pain well controlled.  HE states he is coughing more today and throat is sore.  He is having some gas out of ostomy    Filed Vitals:   05/28/16 0502 05/28/16 1825  BP: 120/72 120/73  Pulse:  105  Temp: 98.3 F (36.8 C) 98.5 F (36.9 C)  Resp: 20 18   I/O last 3 completed shifts: In: 4216.2 [P.O.:1230; I.V.:2773.9; Other:120; IV Piggyback:92.4] Out: 1825 [id number]; Drains:150; Blood:100]    PE:  Gen: NAD [id number], appropriately tender, midline incision no erythema or purulence, JP drains x2 with minimal serous drainage.  Ext: 2+ pulses, no edema  CBC Latest Ref Rng 05/26/2016 05/25/2016 05/24/2016  WBC 3.8 - 10.6 K/uL 7.5 6.6 9.2  Hemoglobin 13.0 - 18.0 g/dL 12.1(L) 11.7(L) 12.1(L)  Hematocrit 40.0 - 52.0 % 34.8(L) 33.8(L) 34.6(L)  Platelets 150 - 440 K/uL 179 173 169    CMP Latest Ref Rng 05/28/2016 05/27/2016 05/26/2016  Glucose 65 - 99 mg/dL 130(H) 110(H) 107(H)  BUN 6 - 20 mg/dL 10 10 12   Creatinine 0.61 - 1.24 mg/dL 0.82 0.89 0.83  Sodium 135 - 145 mmol/L 135 137 138  Potassium 3.5 - 5.1 mmol/L 3.9 3.6 3.4(L)  Chloride 101 - 111 mmol/L 104 106 106  CO2 22 - 32 mmol/L 26 28 26   Calcium 8.9 - 10.3 mg/dL 7.8(L) 7.8(L) 7.6(L)  Total Protein 6.5 - 8.1 g/dL - - 5.1(L)  Total Bilirubin 0.3 - 1.2 mg/dL - - 1.0  Alkaline Phos 38 - 126 U/L - - 45  AST 15 - 41 U/L - - 23  ALT 17 - 63 U/L - - 21     A/P: 55 yr old POD#6 from Hartman's procedure for perforated diverticulitis with purulent peritonitis POD#1 from Dehiscence with re-Exploratory laparotomy and closure.  Pain: neurotin and toradol  [id number] liquids, ostomy nurse to do more teaching today, will monitor jp drain output, continue zosyn  for purulent peritonitis  GU: voiding well, decrease IV fluids.  Encourage ambulation, PT to evaluate tomorrow SW: will need Home health nursing to help with new ostomy and JP drains, will have PT re-evaluate

## 2016-05-28 NOTE — Progress Notes (Signed)
PT Cancellation Note  Patient Details Name: Clayton Butler MRN: [id number] DOB: 1961-03-06   Cancelled Treatment:    Reason Eval/Treat Not Completed: Other (comment). Patient had been seen previously by PT, however patient rushed into emergency surgery yesterday and placed under general anesthesia. Per PT protocol patient's orders will be discontinued due to change in status (going under general). Please re-consult PT when patient is available/ appropriate. Will discuss with RN.   Kerman Passey, PT, DPT    05/28/2016, 2:21 PM

## 2016-05-29 LAB — BASIC METABOLIC PANEL
ANION GAP: 6 (ref 5–15)
BUN: 12 mg/dL (ref 6–20)
CALCIUM: 7.5 mg/dL — AB (ref 8.9–10.3)
CO2: 26 mmol/L (ref 22–32)
Chloride: 104 mmol/L (ref 101–111)
Creatinine, Ser: 0.86 mg/dL (ref 0.61–1.24)
GFR calc non Af Amer: >60 mL/min (ref >60)
Glucose, Bld: 105 mg/dL — ABNORMAL HIGH (ref 65–99)
POTASSIUM: 3.9 mmol/L (ref 3.5–5.1)
Sodium: 136 mmol/L (ref 135–145)

## 2016-05-29 NOTE — Evaluation (Signed)
Physical Therapy Re-evaluation Patient Details Name: Clayton Butler MRN: [id number] DOB: 1961-09-30 Today's Date: 05/29/2016   History of Present Illness  55 yo male with onset of abd pain and discovery of diverticulitis with perforation. 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 On 6/20 pt was found to have dehiscence from incision and necrosis of ostomy. PT now consulted for re-evaluation.  Clinical Impression  Pt demonstrated generalized weakness and difficulty walking due to extended hospital stay and medical complications. He is doing much better now, but continues to have functional deficits. Transfers and ambulation up to 320 ft with min guard and 1 UE support (holding IV pole). Demonstrated slow but generally steady gait. He would benefit from gait training with a SPC. LE strength grossly 4/5 and balance is fair in standing. HHPT recommended after hospital discharge to address deficits of strength , gait, balance and endurance to progress towards PLOF. SPC is recommended until pt progresses towards PLOF. Pt will benefit from skilled PT services to increase functional I and mobility for safe discharge.     Follow Up Recommendations Home health PT;Supervision - Intermittent    Equipment Recommendations  Cane    Recommendations for Other Services       Precautions / Restrictions Restrictions Weight Bearing Restrictions: No      Mobility  Bed Mobility               General bed mobility comments: up in recliner, NT  Transfers Overall transfer level: Needs assistance Equipment used: 1 person hand held assist Transfers: Sit to/from Stand;Stand Pivot Transfers Sit to Stand: Min guard Stand pivot transfers: Min guard       General transfer comment: steady with no LOB  Ambulation/Gait Ambulation/Gait assistance: Min guard Ambulation Distance (Feet): 320 Feet Assistive device:  (holding IV pole) Gait Pattern/deviations: Step-through pattern;Narrow base of  support Gait velocity: reduced Gait velocity interpretation: Below normal speed for age/gender General Gait Details: Generally steady with fair speed. Occasional scissoring of feet causing mild off balance.  Stairs            Wheelchair Mobility    Modified Rankin (Stroke Patients Only)       Balance Overall balance assessment: Needs assistance Sitting-balance support: No upper extremity supported Sitting balance-Leahy Scale: Normal     Standing balance support: Single extremity supported Standing balance-Leahy Scale: Fair Standing balance comment: maintains with no LOB                             Pertinent Vitals/Pain Pain Assessment: No/denies pain    Home Living Family/patient expects to be discharged to:: Private residence Living Arrangements: Alone Available Help at Discharge: Family Type of Home: House Home Access: Stairs to enter Entrance Stairs-Rails: None Technical brewer of Steps: 1 Home Layout: One level Home Equipment: None      Prior Function Level of Independence: Independent         Comments: I with ADLs, works at a golf [employer]     Journalist, newspaper        Extremity/Trunk Assessment   Upper Extremity Assessment: Overall WFL for tasks assessed           Lower Extremity Assessment: Generalized weakness (grossly 4/5)      Cervical / Trunk Assessment: Normal  Communication   Communication: No difficulties  Cognition Arousal/Alertness: Awake/alert Behavior During Therapy: WFL for tasks assessed/performed Overall Cognitive Status: Within Functional Limits for tasks assessed  General Comments General comments (skin integrity, edema, etc.): B JP drains, abdominal incision    Exercises Other Exercises Other Exercises: Pt ambulated in room and 320 ft with 1 UE supports on IV pole and min guard. Reduced speed but generally steady. Occasional scissoring gait. Cues for increased BOS       Assessment/Plan    PT Assessment Patient needs continued PT services  PT Diagnosis Difficulty walking;Generalized weakness   PT Problem List Decreased strength;Decreased range of motion;Decreased activity tolerance;Decreased balance;Decreased mobility;Decreased coordination;Decreased knowledge of use of DME;Decreased safety awareness;Decreased knowledge of precautions;Cardiopulmonary status limiting activity;Decreased skin integrity;Pain  PT Treatment Interventions DME instruction;Gait training;Stair training;Functional mobility training;Therapeutic activities;Therapeutic exercise;Balance training;Neuromuscular re-education;Patient/family education   PT Goals (Current goals can be found in the Care Plan section) Acute Rehab PT Goals Patient Stated Goal: to get home PT Goal Formulation: With patient Time For Goal Achievement: 06/12/16 Potential to Achieve Goals: Good    Frequency Min 2X/week   Barriers to discharge Decreased caregiver support lives alone    Co-evaluation               End of Session Equipment Utilized During Treatment: Gait belt Activity Tolerance: Patient tolerated treatment well Patient left: in chair;with call bell/phone within reach Nurse Communication: Mobility status         Time: [id number] PT Time Calculation (min) (ACUTE ONLY): 23 min   Charges:   PT Evaluation $PT Re-evaluation: 1 Procedure PT Treatments $Gait Training: 8-22 mins   PT G Codes:        Neoma Laming, PT, DPT  05/29/2016, 2:09 PM [phone]

## 2016-05-29 NOTE — Progress Notes (Signed)
MEDICATION RELATED CONSULT NOTE - Follow up   Pharmacy Consult for Electrolyte Supplementation Indication: hypokalemia  Allergies  Allergen Reactions  . Morphine And Related Other (See Comments)    "I passed out"    Patient Measurements: Height: 6\' 2"  (188 cm) Weight: 210 lb (95.255 kg) IBW/kg (Calculated) : 82.2   Vital Signs: Temp: 98.4 F (36.9 C) (06/22 0516) Temp Source: Oral (06/22 0516) BP: 120/71 mmHg (06/22 0516) Pulse Rate: 94 (06/22 0516) Intake/Output from previous day: 06/21 0701 - 06/22 0700 In: 2137 [P.O.:750; I.V.:1135; IV Piggyback:132] Out: 650 [Urine:500; Drains:150] Intake/Output from this shift:    Labs:  Recent Labs  05/27/16 0459 05/28/16 0509 05/29/16 0534  CREATININE 0.89 0.82 0.86  MG 1.9  --   --   PHOS 3.6  --   --    Estimated Creatinine Clearance: 114.2 mL/min (by C-G formula based on Cr of 0.86).   Assessment: Pharmacy consulted to supplement electrolytes in a 55 yo male admitted with diverticulitis.   K: 3.9, Mag: 1.9, Phos 3.6   Plan:  No supplementation needed at this time.  Electrolytes have been WNL the last 3 days. Pharmacy will sign off. Thank you for the consult.  Ramond Dial, Pharm.D Clinical Pharmacist   05/29/2016 7:54 AM

## 2016-05-29 NOTE — Progress Notes (Signed)
55 yr old POD#7 from Hartman's procedure for perforated diverticulitis with purulent peritonitis POD#2 from Dehiscence with re-Exploratory laparotomy and closure.   Patient up and sitting in chair today.  HE states heartburn much improved since passed flatus yesterday.  He noted less gas out today but states that he had no nausea or heartburn with clear liquids.   Filed Vitals:   05/29/16 0516 05/29/16 1319  BP: 120/71 127/74  Pulse: 94 96  Temp: 98.4 F (36.9 C) 99.2 F (37.3 C)  Resp: 18 17   I/O last 3 completed shifts: In: 3469.4 [P.O.:750; I.V.:2427.2; Other:120; IV Piggyback:172.2] Out: 32 [Urine:500; Drains:220; Blood:100] Total I/O In: 165 [Other:165] Out: [id number] [Urine:1050]  PE:  Gen: NAD [id number], appropriately tender, midline incision with serosanginous drainage JP drains x2 with serosanginous drainage.  Ext: 2+ pulses, no edema  CBC Latest Ref Rng 05/26/2016 05/25/2016 05/24/2016  WBC 3.8 - 10.6 K/uL 7.5 6.6 9.2  Hemoglobin 13.0 - 18.0 g/dL 12.1(L) 11.7(L) 12.1(L)  Hematocrit 40.0 - 52.0 % 34.8(L) 33.8(L) 34.6(L)  Platelets 150 - 440 K/uL 179 173 169    CMP Latest Ref Rng 05/29/2016 05/28/2016 05/27/2016  Glucose 65 - 99 mg/dL 105(H) 130(H) 110(H)  BUN 6 - 20 mg/dL 12 10 10   Creatinine 0.61 - 1.24 mg/dL 0.86 0.82 0.89  Sodium 135 - 145 mmol/L 136 135 137  Potassium 3.5 - 5.1 mmol/L 3.9 3.9 3.6  Chloride 101 - 111 mmol/L 104 104 106  CO2 22 - 32 mmol/L 26 26 28   Calcium 8.9 - 10.3 mg/dL 7.5(L) 7.8(L) 7.8(L)  Total Protein 6.5 - 8.1 g/dL - - -  Total Bilirubin 0.3 - 1.2 mg/dL - - -  Alkaline Phos 38 - 126 U/L - - -  AST 15 - 41 U/L - - -  ALT 17 - 63 U/L - - -     A/P: 55 yr old POD#7 from Hartman's procedure for perforated diverticulitis with purulent peritonitis POD#2 from Dehiscence with re-Exploratory laparotomy and closure.  Pain: neurotin and toradol  [id number] to full liquids, continue ostomy care, will monitor jp drain output, continue zosyn for purulent  peritonitis  GU: voiding well, decrease IV fluids.  Encourage ambulation, PT to evaluate tomorrow SW: will need Home health nursing to help with new ostomy and JP drains, PT recommending HH PT

## 2016-05-29 NOTE — Op Note (Signed)
Exploratory Laparotomy, revision of ostomy Procedure Note  Indications: The patient had a Hartman's for perforated diverticulitis 5 days prior and as he was getting up to the chair starting having severe pain at the incision site.  He then continued to have pain in his back and increased drainage from wound.  Upon examination he was found to have a dehiscence with 2 loops of bowel protruding from the incision site. Patient was then taken to the operating room emergently to place the bowel back in the abdomen.  Pre-operative Diagnosis: Dehiscence from incision, necrosis of ostomy  Post-operative Diagnosis: Same  Surgeon: Hubbard Robinson   Assistants: none  Anesthesia: General endotracheal anesthesia  ASA Class: 3  Procedure Details  The patient was seen in the Holding Room. The risks, benefits, complications, treatment options, and expected outcomes were discussed with the patient. The possibilities of reaction to medication, pulmonary aspiration, perforation of viscus, bleeding, recurrent infection, the need for additional procedures, failure to diagnose a condition, and creating a complication requiring transfusion or operation were discussed with the patient. The patient concurred with the proposed plan, giving informed consent.  The site of surgery properly noted/marked. The patient was taken to the Operating Room, identified as Clayton Butler and the procedure verified as Exploratory Laparotomy. A Time Out was held and the above information confirmed.  The patient was placed supine after induction of a general anesthetic, the abdomen was prepped and draped in the standard fashion.  The sutures on the fascia had ruptured where the bowel had protruded through bowel was placed back into the abdomen. The PDS was then removed to open up the abdomen along the midline incision. The abdomen was inspected and there was no purulence or murky fluid seen. The entire abdomen was inspected and  irrigated out with saline. The 2 superior drains one in the right upper quadrant and one in the left upper quadrant were then removed under direct visualization and cutting the sutures and secured into the skin.   Ostomy was inspected and appeared to be necrotic at the very tip of ostomy site this was taken away from the skin and able to pull the the colon up through the ostomy site. A GIA stapler was then used to resect the distal 2 cm of the ostomy. The mesentery was then secured to the skin using the Brooke technique a rosebud was made and securedout using 3-0 silk sutures in interrupted fashion. Once this was done Tegaderm was placed over this area.   The abdominal cavity was irrigated. Hemostasis was confirmed. The midline incision was closed with a running #1 loop PDS suture, with internal retention sutures of figure-of-eight 0 Vicryl every 3 stitches. The soft tissue was irrigated. The skin incision was closed with staples.  The previous JP drain sites that have been removed were also closed with staples. Sterile dressings were applied.  A new ostomy appliance was also placed.    Instrument, sponge, and needle counts were correct prior to abdominal closure and at the conclusion of the case.   Findings: Dehiscence of the midline incision just below the umbilicus were about 4 cm area, bowel returned to the abdominal cavity no other injuries seen, upper 2 JP drains removed, ostomy tip necrosis in 2 cm removed and ostomy revised.  Estimated Blood Loss:  less than 50 mL         Drains: two jP drains in the pelvis, right and left         Total IV  Fluids: 1082ml         Specimens: none         Implants: none         Complications:  None; patient tolerated the procedure well.         Disposition: PACU - hemodynamically stable.         Condition: stable

## 2016-05-30 NOTE — Progress Notes (Signed)
55 yr old POD#8 from Hartman's procedure for perforated diverticulitis with purulent peritonitis POD#3 from Dehiscence with re-Exploratory laparotomy and closure.   Patient doing well today. States did well with full liquids, having some gas from ostomy.  He denies any nausea or heartburn, has been up with PT.   Filed Vitals:   05/30/16 0605 05/30/16 1213  BP: 129/66 122/64  Pulse: 81 86  Temp: 98.2 F (36.8 C) 98 F (36.7 C)  Resp: 20 18   I/O last 3 completed shifts: In: 2502 [I.V.:2190; IV Piggyback:312] Out: [id number] [Urine:3450; Drains:475] Total I/O In: 1303.8 [P.O.:840; I.V.:425.7; IV Piggyback:38.1] Out: 380 [Urine:300; Drains:60; Stool:20]  PE:  Gen: NAD [id number], appropriately tender, midline incision with serosanginous drainage JP drains x2 with serosanginous drainage.  Ext: 2+ pulses, no edema  CBC Latest Ref Rng 05/26/2016 05/25/2016 05/24/2016  WBC 3.8 - 10.6 K/uL 7.5 6.6 9.2  Hemoglobin 13.0 - 18.0 g/dL 12.1(L) 11.7(L) 12.1(L)  Hematocrit 40.0 - 52.0 % 34.8(L) 33.8(L) 34.6(L)  Platelets 150 - 440 K/uL 179 173 169    CMP Latest Ref Rng 05/29/2016 05/28/2016 05/27/2016  Glucose 65 - 99 mg/dL 105(H) 130(H) 110(H)  BUN 6 - 20 mg/dL 12 10 10   Creatinine 0.61 - 1.24 mg/dL 0.86 0.82 0.89  Sodium 135 - 145 mmol/L 136 135 137  Potassium 3.5 - 5.1 mmol/L 3.9 3.9 3.6  Chloride 101 - 111 mmol/L 104 104 106  CO2 22 - 32 mmol/L 26 26 28   Calcium 8.9 - 10.3 mg/dL 7.5(L) 7.8(L) 7.8(L)  Total Protein 6.5 - 8.1 g/dL - - -  Total Bilirubin 0.3 - 1.2 mg/dL - - -  Alkaline Phos 38 - 126 U/L - - -  AST 15 - 41 U/L - - -  ALT 17 - 63 U/L - - -     A/P: 55 yr old POD#8 from Hartman's procedure for perforated diverticulitis with purulent peritonitis POD#3 from Dehiscence with re-Exploratory laparotomy and closure.  Pain: neurotin and toradol  [id number] to regular diet, continue ostomy care, will monitor jp drain output, continue zosyn for purulent peritonitis  GU: voiding well,  decrease IV fluids.  Encourage ambulation, PT to evaluate tomorrow SW: will need Home health nursing to help with new ostomy and JP drains, PT recommending HH PT

## 2016-05-30 NOTE — Consult Note (Signed)
WOC ostomy consult note Stoma type/location: LLQ end colostomy, s/p Hartmann's procedure. Surgical revision 2 days ago.  Teaching and pouch change today.  Stomal assessment/size: 1 1/4" round stoma, budded and pink.  Patient states that MD revised previous stoma.  No duskiness noted today.    Brown stool tinged liquid present in pouch. Peristomal assessment: Intact. Drains in place, stoma is pink moist and budded Os at center.  Treatment options for stomal/peristomal skin: Barrier ring.  Output Stool tinged liquid Ostomy pouching: 2pc. 2 1/4" pouch  Education provided: Patient participated in pouch change and rationale for barrier ring explained. Stoma measured and barrier cut to fit. Discussed changing pouch twice weekly and emptying several times daily when 1/3 full. Verbalized understanding.  Recommend HH for ongoing education and wound care.  Enrolled patient in [facility] program: yes [place] team will monitor and remain available to patient, medical and nursing teams.  Domenic Moras RN BSN [place] Pager [phone]

## 2016-05-31 LAB — CBC
HCT: 31.6 % — ABNORMAL LOW (ref 40.0–52.0)
Hemoglobin: 11 g/dL — ABNORMAL LOW (ref 13.0–18.0)
MCH: 36.2 pg — AB (ref 26.0–34.0)
MCHC: 34.9 g/dL (ref 32.0–36.0)
MCV: 103.8 fL — ABNORMAL HIGH (ref 80.0–100.0)
PLATELETS: 259 10*3/uL (ref 150–440)
RBC: 3.04 MIL/uL — AB (ref 4.40–5.90)
RDW: 14.1 % (ref 11.5–14.5)
WBC: 9.7 10*3/uL (ref 3.8–10.6)

## 2016-05-31 NOTE — Progress Notes (Signed)
55 yr old POD#9 from Hartman's procedure for perforated diverticulitis with purulent peritonitis POD#4 from Dehiscence with re-Exploratory laparotomy and closure.  Patient was able to eat regular diet yesterday without any issues.  He states that he has had some gas and a small amount of stool from ostomy as well.  He states that he sneezed earlier and caused spasm pain afterward but otherwise he has had good pain control.     Filed Vitals:   05/31/16 0450 05/31/16 1207  BP: 127/91 112/70  Pulse: 87 87  Temp: 98.2 F (36.8 C) 98.3 F (36.8 C)  Resp: 16    I/O last 3 completed shifts: In: 3259.3 [P.O.:1560; I.V.:1531.2; IV Piggyback:168.1] Out: [id number] [Urine:4050; Drains:370; Stool:20] Total I/O In: 808 [P.O.:240; I.V.:342; IV Piggyback:226] Out: 425 [Urine:375; Drains:50]  PE:  Gen: NAD [id number], appropriately tender, midline incision with serosanginous drainage inferior skin macerated from staying moist, 4 staples removed and packed with dry gauze, JP drains x2 with serosanginous drainage.  Ext: 2+ pulses, no edema  CBC Latest Ref Rng 05/31/2016 05/26/2016 05/25/2016  WBC 3.8 - 10.6 K/uL 9.7 7.5 6.6  Hemoglobin 13.0 - 18.0 g/dL 11.0(L) 12.1(L) 11.7(L)  Hematocrit 40.0 - 52.0 % 31.6(L) 34.8(L) 33.8(L)  Platelets 150 - 440 K/uL 259 179 173    CMP Latest Ref Rng 05/29/2016 05/28/2016 05/27/2016  Glucose 65 - 99 mg/dL 105(H) 130(H) 110(H)  BUN 6 - 20 mg/dL 12 10 10   Creatinine 0.61 - 1.24 mg/dL 0.86 0.82 0.89  Sodium 135 - 145 mmol/L 136 135 137  Potassium 3.5 - 5.1 mmol/L 3.9 3.9 3.6  Chloride 101 - 111 mmol/L 104 104 106  CO2 22 - 32 mmol/L 26 26 28   Calcium 8.9 - 10.3 mg/dL 7.5(L) 7.8(L) 7.8(L)  Total Protein 6.5 - 8.1 g/dL - - -  Total Bilirubin 0.3 - 1.2 mg/dL - - -  Alkaline Phos 38 - 126 U/L - - -  AST 15 - 41 U/L - - -  ALT 17 - 63 U/L - - -     A/P: 55 yr old POD#9 from Hartman's procedure for perforated diverticulitis with purulent peritonitis POD#4 from Dehiscence with  re-Exploratory laparotomy and closure.  Pain: neurotin and toradol  [id number] with regular diet, continue ostomy care, dry dressings to wound TID and when soiled, will monitor jp drain output, continue zosyn for purulent peritonitis  GU: voiding well, d/c IV fluids Encourage ambulation SW: will need Home health nursing to help with new ostomy and JP drains, PT recommending HH PT

## 2016-06-01 LAB — GLUCOSE, CAPILLARY: GLUCOSE-CAPILLARY: 122 mg/dL — AB (ref 65–99)

## 2016-06-01 MED ORDER — NICOTINE 14 MG/24HR TD PT24: 14.0000 mg | MEDICATED_PATCH | Freq: Every day | TRANSDERMAL | Status: DC

## 2016-06-01 MED ORDER — AMOXICILLIN-POT CLAVULANATE 875-125 MG PO TABS: 1.0000 | ORAL_TABLET | Freq: Two times a day (BID) | ORAL | Status: DC

## 2016-06-01 MED ORDER — OXYCODONE-ACETAMINOPHEN 5-325 MG PO TABS: 1.0000 | ORAL_TABLET | ORAL | Status: DC | PRN

## 2016-06-01 MED ORDER — PIPERACILLIN-TAZOBACTAM 3.375 G IVPB
3.3750 g | Freq: Three times a day (TID) | INTRAVENOUS | Status: DC
  Administered 2016-06-01: 3.375 g via INTRAVENOUS
  Filled 2016-06-01 (×2): qty 50

## 2016-06-01 NOTE — Discharge Summary (Signed)
Physician Discharge Summary  Patient ID: Clayton Butler MRN: [id number] DOB/AGE: January 22, 1961 55 y.o.  Admit date: 05/20/2016 Discharge date: 06/01/2016  Admission Diagnoses:  Perforated Diverticulitis   Discharge Diagnoses:  Active Problems:   Diverticulitis of colon with perforation   Discharged Condition: fair  Hospital Course: 55 yr old male with perforated diverticulitis.  He underwent Exlap and hartman's with placement of 4 JP drains on 6/15.  Patient was doing well, eating a regular diet and having good ostomy output and up with physical therapy.  On 6/20 he had a dehiscence and went back to OR for Ex lap, revision of ostomy and removal of superior two J.P. drains.  Patient has since done well.  He is tolerating regular diet, good ostomy output.  Incision with some serous to serosanguinous drainage. He will go home with home health nursing for ostomy, dry dressing changes to wound TID and JP drain management.  He also was seen by physical therapy which recommended home health PT. This was ordered as well.  He is to f/u next week in the office.    Consults: None  Significant Diagnostic Studies: CT scan   Treatments: surgery: Ex Lap with hartman's and Exlap for dehiscence.    Discharge Exam: Blood pressure 122/69, pulse 88, temperature 98.8 F (37.1 C), temperature source Oral, resp. rate 16, height 6\' 2"  (1.88 m), weight 210 lb (95.255 kg), SpO2 96 %. General appearance: alert, cooperative and no distress GI: soft appropriately tender, incision with drainage around umbilicus serous, and serosanginous in inferior with a 4cm area of opening with good granulation tissue, jp drains with serous drianage  Extremities: extremities normal, atraumatic, no cyanosis or edema  Disposition: Final discharge disposition not confirmed  Discharge Instructions    Call MD for:  persistant nausea and vomiting    Complete by:  As directed      Call MD for:  redness, tenderness, or signs of  infection (pain, swelling, redness, odor or green/yellow discharge around incision site)    Complete by:  As directed      Call MD for:  severe uncontrolled pain    Complete by:  As directed      Call MD for:  temperature >100.4    Complete by:  As directed      Diet - low sodium heart healthy    Complete by:  As directed      Discharge instructions    Complete by:  As directed   Empty, measure and recharge JP drain once daily     Discharge wound care:    Complete by:  As directed   Dry dressings around JP drain sites change once daily Dry dressing packing to midline wound and over wound for drainage change TID     Driving Restrictions    Complete by:  As directed   No driving while on prescription pain medication     Increase activity slowly    Complete by:  As directed      Lifting restrictions    Complete by:  As directed   No lifting over 15lbs for 6 weeks     May shower / Bathe    Complete by:  As directed      May walk up steps    Complete by:  As directed             Medication List    TAKE these medications        amoxicillin-clavulanate 875-125 MG tablet  Commonly  known as:  AUGMENTIN  Take 1 tablet by mouth 2 (two) times daily.     nicotine 14 mg/24hr patch  Commonly known as:  NICODERM CQ - dosed in mg/24 hours  Place 1 patch (14 mg total) onto the skin daily.     oxyCODONE-acetaminophen 5-325 MG tablet  Commonly known as:  PERCOCET/ROXICET  Take 1-2 tablets by mouth every 4 (four) hours as needed for moderate pain.           Follow-up Information    Call Hubbard Robinson, MD.   Specialty:  Surgery   Why:  f/u in 1 week with Dr. Azalee Course for wound check and possible jp drain removal   Contact information:   [place] [address] [phone]       Signed: Hubbard Robinson 06/01/2016, 1:36 PM

## 2016-06-01 NOTE — Care Management Note (Signed)
Case Management Note  Patient Details  Name: Clayton Butler MRN: [id number] Date of Birth: [birth year]  Subjective/Objective:      [name] previously chose [facility], and a referral for home health RN and PT were faxed and called to [facility]. Dr Azalee Course requested that a home health RN see [name] tomorrow on 06/02/16 at his home. This request was faxed to [facility].               Action/Plan:   Expected Discharge Date:                  Expected Discharge Plan:     In-House Referral:     Discharge planning Services     Post Acute Care Choice:    Choice offered to:     DME Arranged:    DME Agency:     HH Arranged:    HH Agency:     Status of Service:     If discussed at [employer] of Stay Meetings, dates discussed:    Additional Comments:  Kathy Wahid A, RN 06/01/2016, 11:42 AM

## 2016-06-01 NOTE — Progress Notes (Signed)
06/01/2016 3:39 PM  Nelida Gores to be D/C'd Home per MD order.  Discussed prescriptions and follow up appointments with the patient. Prescriptions given to patient, medication list explained in detail. Pt verbalized understanding.    Medication List    TAKE these medications        amoxicillin-clavulanate 875-125 MG tablet  Commonly known as:  AUGMENTIN  Take 1 tablet by mouth 2 (two) times daily.     nicotine 14 mg/24hr patch  Commonly known as:  NICODERM CQ - dosed in mg/24 hours  Place 1 patch (14 mg total) onto the skin daily.     oxyCODONE-acetaminophen 5-325 MG tablet  Commonly known as:  PERCOCET/ROXICET  Take 1-2 tablets by mouth every 4 (four) hours as needed for moderate pain.        Filed Vitals:   06/01/16 0541 06/01/16 1344  BP: 122/69 125/72  Pulse: 88 90  Temp: 98.8 F (37.1 C) 98.4 F (36.9 C)  Resp: 16     Skin clean, dry and intact without evidence of skin break down, no evidence of skin tears noted. IV catheter discontinued intact. Site without signs and symptoms of complications. Dressing and pressure applied. Pt denies pain at this time. No complaints noted.  An After Visit Summary was printed and given to the patient. Patient escorted via [place], and D/C home via private auto.  Dola Argyle

## 2016-06-06 ENCOUNTER — Encounter: Payer: Self-pay | Admitting: Surgery

## 2016-06-06 ENCOUNTER — Ambulatory Visit (INDEPENDENT_AMBULATORY_CARE_PROVIDER_SITE_OTHER): Payer: BLUE CROSS/BLUE SHIELD | Admitting: Surgery

## 2016-06-06 VITALS — BP 130/73 | HR 119 | Temp 97.8°F | Ht 74.0 in | Wt 191.4 lb

## 2016-06-06 DIAGNOSIS — K5720 Diverticulitis of large intestine with perforation and abscess without bleeding: Secondary | ICD-10-CM

## 2016-06-06 MED ORDER — OXYCODONE-ACETAMINOPHEN 5-325 MG PO TABS: 1.0000 | ORAL_TABLET | Freq: Four times a day (QID) | ORAL | Status: DC | PRN

## 2016-06-06 NOTE — Patient Instructions (Signed)
Please see your appointments listed below.

## 2016-06-09 ENCOUNTER — Encounter: Payer: Self-pay | Admitting: Surgery

## 2016-06-09 NOTE — Progress Notes (Signed)
55 yr old s/p perforated diverticulitis requiring a Hartman's and later evisceration from incision site requiring further exploratory laparotomy. Patient has been home for 5 days and has been doing well. Patient states that his appetite is returning and he has had good output out of the ostomy. Patient states that her nursing come out and was helping him changing the bag. Patient states that there has been minimal out of the JP drains [place] 25 per day and it has been yellow colored. Patient states that overall he feels he is getting part of his strength back but does still feel very tired. Patient denies any fever or chills. He is still changing a dry dressing to his midline wound 3 times a day.  Filed Vitals:   06/06/16 1100  BP: 130/73  Pulse: 119  Temp: 97.8 F (36.6 C)   PE:  GEN: NAD, very pleasant [id number], appropriately tender, JP drains minimal serous drainage, removed today, some serous drainage from umbilical portion of midline, and open in last 4cm of wound, stitches visible with a lot of laxity but no visible bowel or opening into the abdominal cavity, only good granulation tissue with serous drainage to dressings Ext: 2+ pulses no edema  A/P:  Patient is doing well since 2 large operations. Discussed with him that his appetite and his energy will slowly come back but will take a few weeks. I did again discussed smoking cessation with them and since patches cause him to have the shakes suggested trying nicotine gum and again discussed with them that his healing will be better if he does not smoke. He is to continue dry dressing changes to the wound 3 times a day since it continues to drain a large amount of serous fluid and does appear to have a lot of laxity in the stitches.  JP drains were removed. He is to continue his home health nursing for help with ostomy and wound changes and to continue his home health physical therapy for deconditioning. He'll follow-up with Dr. Dahlia Byes next week.

## 2016-06-12 ENCOUNTER — Ambulatory Visit (INDEPENDENT_AMBULATORY_CARE_PROVIDER_SITE_OTHER): Payer: BLUE CROSS/BLUE SHIELD | Admitting: Surgery

## 2016-06-12 ENCOUNTER — Encounter: Payer: Self-pay | Admitting: Surgery

## 2016-06-12 VITALS — BP 146/77 | HR 112 | Temp 98.0°F | Ht 74.0 in | Wt 188.0 lb

## 2016-06-12 DIAGNOSIS — Z09 Encounter for follow-up examination after completed treatment for conditions other than malignant neoplasm: Secondary | ICD-10-CM

## 2016-06-12 NOTE — Progress Notes (Signed)
55 yr old s/p perforated diverticulitis requiring a Hartman's and later evisceration from incision site requiring further exploratory laparotomy.  Doing well, taking time to recuperate He continues to smoke Taking PO, good output from ostomy Doing wet/dry to lower incision appetite is better Off A/Bs  PE NAD Abd: soft, nt, open lower wound now w loose looped PDS, suture removed. No evidence of evisceration, w granulation tissue There is another cavity near umbilicus that was repacked. Ostomy pink and patent  A/P recuperating slowly as expected from two major emergent interventions likely will develop a large ad wall defect His tissue and fascia are very weak and the PDS pulled though, currently no need for surgical intervention Pt to wear binder and keep wound cover at all times F/U next Week with Dr. Azalee Course to make sure his wound is healing well and he does not develop another evisceration Counseled about smoking cessation

## 2016-06-17 ENCOUNTER — Other Ambulatory Visit: Payer: Self-pay

## 2016-06-18 ENCOUNTER — Ambulatory Visit (INDEPENDENT_AMBULATORY_CARE_PROVIDER_SITE_OTHER): Payer: BLUE CROSS/BLUE SHIELD | Admitting: Surgery

## 2016-06-18 ENCOUNTER — Telehealth: Payer: Self-pay

## 2016-06-18 ENCOUNTER — Encounter: Payer: Self-pay | Admitting: Surgery

## 2016-06-18 VITALS — BP 128/82 | HR 94 | Temp 98.6°F | Ht 74.0 in | Wt 187.6 lb

## 2016-06-18 DIAGNOSIS — K5720 Diverticulitis of large intestine with perforation and abscess without bleeding: Secondary | ICD-10-CM

## 2016-06-18 MED ORDER — BUPROPION HCL ER (SR) 150 MG PO TB12: 150.0000 mg | ORAL_TABLET | ORAL | Status: DC

## 2016-06-18 NOTE — Telephone Encounter (Signed)
Call made to [facility] [phone] in regards to patient's supplies. He is down to 1 bag and flange at this time. He received a package today from [employer] and received only 1 bag and flange.  Spoke with Sharee Pimple whom explained that she will let the nurse know what is going on and she should be going out today to assess the situation as well. But most likely patient will need to notify Denzil Hughes [phone] and find out what the problem is.

## 2016-06-18 NOTE — Progress Notes (Signed)
55 yr old male who had perforated diverticulitis and underwent exploratory laparotomy Hartman's procedure and then later had a evisceration and had to have repeat closure. Patient has been doing well and incisions have been healing. He has been doing the packing 3 times a day. His appetite is better and his energy is getting better as well. Patient is no longer having to take pain medications. Patient is having good output from his ostomy. Patient is however still smoking and has been having trouble quitting. He is willing to try other things to quit   Filed Vitals:   06/18/16 1140  BP: 128/82  Pulse: 94  Temp: 98.6 F (37 C)   PE:  Gen: NAD Abd: soft, appropriately tender,  Incision posterior portion granulating in well, some pds and vicryl removed, umbilical area healing well good granulation tissue, ostomy pink, patent, air and stool in bag   A/P:   Patient is doing well. Instructed to continue the dressing changes 3 times a day. Patient was also had smoking cessation counseling and patient was agreeable to quit and we will start him on Wellbutrin and will order him for the nicotine down as well. Patient will return in 2 weeks for a wound check.

## 2016-06-18 NOTE — Patient Instructions (Addendum)
Follow-up in 2 weeks as scheduled below.  Try the Wellbutrin and call if you have any difficulty with the medication. Your target quit date should be during your 2nd week of therapy.  I will call your home health nurse today regarding your supplies. If you would like to speak with Santiago Glad (wound ostomy nurse) at any time let me know.  Continue dry dressing changes three times daily and target the areas under the skin as much as possible by using the Q-tips provided. If you run out, please let us know.  Call with any questions or concerns.  Bupropion sustained-release tablets (smoking cessation) What is this medicine? BUPROPION (byoo PROE pee on) is used to help people quit smoking. This medicine may be used for other purposes; ask your health care provider or pharmacist if you have questions. What should I tell my health care provider before I take this medicine? They need to know if you have any of these conditions: -an eating disorder, such as anorexia or bulimia -bipolar disorder or psychosis -diabetes or high blood sugar, treated with medication -glaucoma -head injury or brain tumor -heart disease, previous heart attack, or irregular heart beat -high blood pressure -kidney or liver disease -seizures -suicidal thoughts or a previous suicide attempt -Tourette's syndrome -weight loss -an unusual or allergic reaction to bupropion, other medicines, foods, dyes, or preservatives -breast-feeding -pregnant or trying to become pregnant How should I use this medicine? Take this medicine by mouth with a glass of water. Follow the directions on the prescription label. You can take it with or without food. If it upsets your stomach, take it with food. Do not cut, crush or chew this medicine. Take your medicine at regular intervals. If you take this medicine more than once a day, take your second dose at least 8 hours after you take your first dose. To limit difficulty in sleeping, avoid taking  this medicine at bedtime. Do not take your medicine more often than directed. Do not stop taking this medicine suddenly except upon the advice of your doctor. Stopping this medicine too quickly may cause serious side effects. A special MedGuide will be given to you by the pharmacist with each prescription and refill. Be sure to read this information carefully each time. Talk to your pediatrician regarding the use of this medicine in children. Special care may be needed. Overdosage: If you think you have taken too much of this medicine contact a poison control center or emergency room at once. NOTE: This medicine is only for you. Do not share this medicine with others. What if I miss a dose? If you miss a dose, skip the missed dose and take your next tablet at the regular time. There should be at least 8 hours between doses. Do not take double or extra doses. What may interact with this medicine? Do not take this medicine with any of the following medications: -linezolid -MAOIs like Azilect, Carbex, Eldepryl, Marplan, Nardil, and Parnate -methylene blue (injected into a vein) -other medicines that contain bupropion like Wellbutrin This medicine may also interact with the following medications: -alcohol -certain medicines for anxiety or sleep -certain medicines for blood pressure like metoprolol, propranolol -certain medicines for depression or psychotic disturbances -certain medicines for HIV or AIDS like efavirenz, lopinavir, nelfinavir, ritonavir -certain medicines for irregular heart beat like propafenone, flecainide -certain medicines for Parkinson's disease like amantadine, levodopa -certain medicines for seizures like carbamazepine, phenytoin, phenobarbital -cimetidine -clopidogrel -cyclophosphamide -furazolidone -isoniazid -nicotine -orphenadrine -procarbazine -steroid medicines like prednisone  or cortisone -stimulant medicines for attention disorders, weight loss, or to stay  awake -tamoxifen -theophylline -thiotepa -ticlopidine -tramadol -warfarin This list may not describe all possible interactions. Give your health care provider a list of all the medicines, herbs, non-prescription drugs, or dietary supplements you use. Also tell them if you smoke, drink alcohol, or use illegal drugs. Some items may interact with your medicine. What should I watch for while using this medicine? Visit your doctor or health care professional for regular checks on your progress. This medicine should be used together with a patient support program. It is important to participate in a behavioral program, counseling, or other support program that is recommended by your health care professional. Patients and their families should watch out for new or worsening thoughts of suicide or depression. Also watch out for sudden changes in feelings such as feeling anxious, agitated, panicky, irritable, hostile, aggressive, impulsive, severely restless, overly excited and hyperactive, or not being able to sleep. If this happens, especially at the beginning of treatment or after a change in dose, call your health care professional. Avoid alcoholic drinks while taking this medicine. Drinking excessive alcoholic beverages, using sleeping or anxiety medicines, or quickly stopping the use of these agents while taking this medicine may increase your risk for a seizure. Do not drive or use heavy machinery until you know how this medicine affects you. This medicine can impair your ability to perform these tasks. Do not take this medicine close to bedtime. It may prevent you from sleeping. Your mouth may get dry. Chewing sugarless gum or sucking hard candy, and drinking plenty of water may help. Contact your doctor if the problem does not go away or is severe. Do not use nicotine patches or chewing gum without the advice of your doctor or health care professional while taking this medicine. You may need to have  your blood pressure taken regularly if your doctor recommends that you use both nicotine and this medicine together. What side effects may I notice from receiving this medicine? Side effects that you should report to your doctor or health care professional as soon as possible: -allergic reactions like skin rash, itching or hives, swelling of the face, lips, or tongue -breathing problems -changes in vision -confusion -fast or irregular heartbeat -hallucinations -increased blood pressure -redness, blistering, peeling or loosening of the skin, including inside the mouth -seizures -suicidal thoughts or other mood changes -unusually weak or tired -vomiting Side effects that usually do not require medical attention (report to your doctor or health care professional if they continue or are bothersome): -change in sex drive or performance -constipation -headache -loss of appetite -nausea -tremors -weight loss This list may not describe all possible side effects. Call your doctor for medical advice about side effects. You may report side effects to FDA at 1-800-FDA-1088. Where should I keep my medicine? Keep out of the reach of children. Store at room temperature between 20 and 25 degrees C (68 and 77 degrees F). Protect from light. Keep container tightly closed. Throw away any unused medicine after the expiration date. NOTE: This sheet is a summary. It may not cover all possible information. If you have questions about this medicine, talk to your doctor, pharmacist, or health care provider.    2016, Elsevier/Gold Standard. (2013-07-22 10:55:10) Steps to Quit Smoking  Smoking tobacco can be harmful to your health and can affect almost every organ in your body. Smoking puts you, and those around you, at risk for developing many serious  chronic diseases. Quitting smoking is difficult, but it is one of the best things that you can do for your health. It is never too late to quit. WHAT ARE THE  BENEFITS OF QUITTING SMOKING? When you quit smoking, you lower your risk of developing serious diseases and conditions, such as:  Lung cancer or lung disease, such as COPD.  Heart disease.  Stroke.  Heart attack.  Infertility.  Osteoporosis and bone fractures. Additionally, symptoms such as coughing, wheezing, and shortness of breath may get better when you quit. You may also find that you get sick less often because your body is stronger at fighting off colds and infections. If you are pregnant, quitting smoking can help to reduce your chances of having a baby of low birth weight. HOW DO I GET READY TO QUIT? When you decide to quit smoking, create a plan to make sure that you are successful. Before you quit:  Pick a date to quit. Set a date within the next two weeks to give you time to prepare.  Write down the reasons why you are quitting. Keep this list in places where you will see it often, such as on your bathroom mirror or in your car or wallet.  Identify the people, places, things, and activities that make you want to smoke (triggers) and avoid them. Make sure to take these actions:  Throw away all cigarettes at home, at work, and in your car.  Throw away smoking accessories, such as Scientist, research (medical).  Clean your car and make sure to empty the ashtray.  Clean your home, including curtains and carpets.  Tell your family, friends, and coworkers that you are quitting. Support from your loved ones can make quitting easier.  Talk with your health care provider about your options for quitting smoking.  Find out what treatment options are covered by your health insurance. WHAT STRATEGIES CAN I USE TO QUIT SMOKING?  Talk with your healthcare provider about different strategies to quit smoking. Some strategies include:  Quitting smoking altogether instead of gradually lessening how much you smoke over a period of time. Research shows that quitting "cold [place]" is more  successful than gradually quitting.  Attending in-person counseling to help you build problem-solving skills. You are more likely to have success in quitting if you attend several counseling sessions. Even short sessions of 10 minutes can be effective.  Finding resources and support systems that can help you to quit smoking and remain smoke-free after you quit. These resources are most helpful when you use them often. They can include:  Online chats with a Social worker.  Telephone quitlines.  Printed Furniture conservator/restorer.  Support groups or group counseling.  Text messaging programs.  Mobile phone applications.  Taking medicines to help you quit smoking. (If you are pregnant or breastfeeding, talk with your health care provider first.) Some medicines contain nicotine and some do not. Both types of medicines help with cravings, but the medicines that include nicotine help to relieve withdrawal symptoms. Your health care provider may recommend:  Nicotine patches, gum, or lozenges.  Nicotine inhalers or sprays.  Non-nicotine medicine that is taken by mouth. Talk with your health care provider about combining strategies, such as taking medicines while you are also receiving in-person counseling. Using these two strategies together makes you more likely to succeed in quitting than if you used either strategy on its own. If you are pregnant or breastfeeding, talk with your health care provider about finding counseling or other support  strategies to quit smoking. Do not take medicine to help you quit smoking unless told to do so by your health care provider. WHAT THINGS CAN I DO TO MAKE IT EASIER TO QUIT? Quitting smoking might feel overwhelming at first, but there is a lot that you can do to make it easier. Take these important actions:  Reach out to your family and friends and ask that they support and encourage you during this time. Call telephone quitlines, reach out to support groups, or work  with a counselor for support.  Ask people who smoke to avoid smoking around you.  Avoid places that trigger you to smoke, such as bars, parties, or smoke-break areas at work.  Spend time around people who do not smoke.  Lessen stress in your life, because stress can be a smoking trigger for some people. To lessen stress, try:  Exercising regularly.  Deep-breathing exercises.  Yoga.  Meditating.  Performing a body scan. This involves closing your eyes, scanning your body from head to toe, and noticing which parts of your body are particularly tense. Purposefully relax the muscles in those areas.  Download or purchase mobile phone or tablet apps (applications) that can help you stick to your quit plan by providing reminders, tips, and encouragement. There are many free apps, such as QuitGuide from the [employer] Office manager for Disease Control and Prevention). You can find other support for quitting smoking (smoking cessation) through smokefree.gov and other websites. HOW WILL I FEEL WHEN I QUIT SMOKING? Within the first 24 hours of quitting smoking, you may start to feel some withdrawal symptoms. These symptoms are usually most noticeable 2-3 days after quitting, but they usually do not last beyond 2-3 weeks. Changes or symptoms that you might experience include:  Mood swings.  Restlessness, anxiety, or irritation.  Difficulty concentrating.  Dizziness.  Strong cravings for sugary foods in addition to nicotine.  Mild weight gain.  Constipation.  Nausea.  Coughing or a sore throat.  Changes in how your medicines work in your body.  A depressed mood.  Difficulty sleeping (insomnia). After the first 2-3 weeks of quitting, you may start to notice more positive results, such as:  Improved sense of smell and taste.  Decreased coughing and sore throat.  Slower heart rate.  Lower blood pressure.  Clearer skin.  The ability to breathe more easily.  Fewer sick days. Quitting  smoking is very challenging for most people. Do not get discouraged if you are not successful the first time. Some people need to make many attempts to quit before they achieve long-term success. Do your best to stick to your quit plan, and talk with your health care provider if you have any questions or concerns.   This information is not intended to replace advice given to you by your health care provider. Make sure you discuss any questions you have with your health care provider.   Document Released: 11/18/2001 Document Revised: 04/10/2015 Document Reviewed: 04/10/2015 Elsevier Interactive Patient Education 2016 [employer].  Smoking Cessation, Tips for Success If you are ready to quit smoking, congratulations! You have chosen to help yourself be healthier. Cigarettes bring nicotine, tar, carbon monoxide, and other irritants into your body. Your lungs, heart, and blood vessels will be able to work better without these poisons. There are many different ways to quit smoking. Nicotine gum, nicotine patches, a nicotine inhaler, or nicotine nasal spray can help with physical craving. Hypnosis, support groups, and medicines help break the habit of smoking. WHAT  THINGS CAN I DO TO MAKE QUITTING EASIER?  Here are some tips to help you quit for good:  Pick a date when you will quit smoking completely. Tell all of your friends and family about your plan to quit on that date.  Do not try to slowly cut down on the number of cigarettes you are smoking. Pick a quit date and quit smoking completely starting on that day.  Throw away all cigarettes.   Clean and remove all ashtrays from your home, work, and car.  On a card, write down your reasons for quitting. Carry the card with you and read it when you get the urge to smoke.  Cleanse your body of nicotine. Drink enough water and fluids to keep your urine clear or pale yellow. Do this after quitting to flush the nicotine from your body.  Learn to  predict your moods. Do not let a bad situation be your excuse to have a cigarette. Some situations in your life might tempt you into wanting a cigarette.  Never have "just one" cigarette. It leads to wanting another and another. Remind yourself of your decision to quit.  Change habits associated with smoking. If you smoked while driving or when feeling stressed, try other activities to replace smoking. Stand up when drinking your coffee. Brush your teeth after eating. Sit in a different chair when you read the paper. Avoid alcohol while trying to quit, and try to drink fewer caffeinated beverages. Alcohol and caffeine may urge you to smoke.  Avoid foods and drinks that can trigger a desire to smoke, such as sugary or spicy foods and alcohol.  Ask people who smoke not to smoke around you.  Have something planned to do right after eating or having a cup of coffee. For example, plan to take a walk or exercise.  Try a relaxation exercise to calm you down and decrease your stress. Remember, you may be tense and nervous for the first 2 weeks after you quit, but this will pass.  Find new activities to keep your hands busy. Play with a pen, coin, or rubber band. Doodle or draw things on paper.  Brush your teeth right after eating. This will help cut down on the craving for the taste of tobacco after meals. You can also try mouthwash.   Use oral substitutes in place of cigarettes. Try using lemon drops, carrots, cinnamon sticks, or chewing gum. Keep them handy so they are available when you have the urge to smoke.  When you have the urge to smoke, try deep breathing.  Designate your home as a nonsmoking area.  If you are a heavy smoker, ask your health care provider about a prescription for nicotine chewing gum. It can ease your withdrawal from nicotine.  Reward yourself. Set aside the cigarette money you save and buy yourself something nice.  Look for support from others. Join a support group or  smoking cessation program. Ask someone at home or at work to help you with your plan to quit smoking.  Always ask yourself, "Do I need this cigarette or is this just a reflex?" Tell yourself, "Today, I choose not to smoke," or "I do not want to smoke." You are reminding yourself of your decision to quit.  Do not replace cigarette smoking with electronic cigarettes (commonly called e-cigarettes). The safety of e-cigarettes is unknown, and some may contain harmful chemicals.  If you relapse, do not give up! Plan ahead and think about what you will do  the next time you get the urge to smoke. HOW WILL I FEEL WHEN I QUIT SMOKING? You may have symptoms of withdrawal because your body is used to nicotine (the addictive substance in cigarettes). You may crave cigarettes, be irritable, feel very hungry, cough often, get headaches, or have difficulty concentrating. The withdrawal symptoms are only temporary. They are strongest when you first quit but will go away within 10-14 days. When withdrawal symptoms occur, stay in control. Think about your reasons for quitting. Remind yourself that these are signs that your body is healing and getting used to being without cigarettes. Remember that withdrawal symptoms are easier to treat than the major diseases that smoking can cause.  Even after the withdrawal is over, expect periodic urges to smoke. However, these cravings are generally short lived and will go away whether you smoke or not. Do not smoke! WHAT RESOURCES ARE AVAILABLE TO HELP ME QUIT SMOKING? Your health care provider can direct you to community resources or hospitals for support, which may include:  Group support.  Education.  Hypnosis.  Therapy.   This information is not intended to replace advice given to you by your health care provider. Make sure you discuss any questions you have with your health care provider.   Document Released: 08/22/2004 Document Revised: 12/15/2014 Document Reviewed:  05/12/2013 Elsevier Interactive Patient Education [employer].

## 2016-06-19 ENCOUNTER — Encounter: Payer: Self-pay | Admitting: Surgery

## 2016-06-23 ENCOUNTER — Telehealth: Payer: Self-pay | Admitting: Surgery

## 2016-06-23 NOTE — Telephone Encounter (Signed)
Please call Santiago Glad, she is taking care of patients wound care. Patient had his sutures removed Wednesday and she states when the packing is removed the color is yellow/green. She would like to discuss that with you. Also, patient has never received his supplies. He is down to one bag and has half a box of packing left.. Please call and advise.

## 2016-06-23 NOTE — Telephone Encounter (Signed)
Spoke with Santiago Glad (Nurse) at this time. She explains that Colostomy supplies have not arrived and she is worried as patient has last bag currently on.  Call was made to [facility] at this time. Spoke with Abigail Butts. Explained to Abigail Butts the situation. She did not have our correct phone number and fax. She was given correct numbers for orders to be faxed and verbal orders for all supplies given over the phone. She states that supplies will be sent out today and patient should receive them tomorrow.  Return phone call made to Santiago Glad at this time. No answer. Left voicemail with above information and asking for a return phone call with any further questions or concerns or if supplies are not received tomorrow.

## 2016-06-25 ENCOUNTER — Encounter: Payer: Self-pay | Admitting: Gastroenterology

## 2016-06-25 ENCOUNTER — Telehealth: Payer: Self-pay | Admitting: Surgery

## 2016-06-25 NOTE — Telephone Encounter (Signed)
Returned phone call to Santiago Glad at this time. Left message stating that this is the standard order and should be enough bags for 1 month as you only want to change the bag every 3rd day because you do not want to cause extra skin breakdown around colostomy site. Also, explained that patient may pick up some tape in the [place] office. If patient stops by, he is in need of 1" paper tape. Asked to call back if anything else is needed.

## 2016-06-25 NOTE — Telephone Encounter (Signed)
Santiago Glad called wanting to know can they pick up tape in the [place] office? Merry Proud is concerned about only having 10 bags.

## 2016-06-30 ENCOUNTER — Encounter: Payer: Self-pay | Admitting: Surgery

## 2016-06-30 ENCOUNTER — Telehealth: Payer: Self-pay | Admitting: Surgery

## 2016-06-30 ENCOUNTER — Ambulatory Visit (INDEPENDENT_AMBULATORY_CARE_PROVIDER_SITE_OTHER): Payer: BLUE CROSS/BLUE SHIELD | Admitting: Surgery

## 2016-06-30 VITALS — BP 122/78 | HR 91 | Temp 98.3°F | Ht 74.0 in | Wt 186.0 lb

## 2016-06-30 DIAGNOSIS — K5720 Diverticulitis of large intestine with perforation and abscess without bleeding: Secondary | ICD-10-CM

## 2016-06-30 MED ORDER — POLYETHYLENE GLYCOL 3350 17 G PO PACK: 17.0000 g | PACK | Freq: Every day | ORAL | 0 refills | Status: DC

## 2016-06-30 MED ORDER — DOCUSATE SODIUM 100 MG PO CAPS: 100.0000 mg | ORAL_CAPSULE | Freq: Two times a day (BID) | ORAL | 0 refills | Status: DC

## 2016-06-30 NOTE — Patient Instructions (Addendum)
Please use the Colace 1-2 times daily and/or the Miralax 17g daily for a goal of steady bowel movements that are not thick or difficult to expel through colostomy.  Ensure that your water intake is greater than 72oz. To help with increasing your bowel movements.  Continue dry dressings on the top wound. Begin wet to dry dressings on the bottom wound. Change both guaze daily. We will recheck both of these areas at your next appointment.  Keep your follow-up appointment as scheduled next week with Dr. Azalee Course.

## 2016-06-30 NOTE — Progress Notes (Signed)
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  He had not had any stool in the bag since Friday.  He was having some cramping pain as well.  Since he called in to tell us he then had a hard "stool ball" come from the ostomy site and then more stool afterward.  He has been doing well otherwise.  He has about 1 cigarette a day now but has been taking Wellbutrin, he has not been using the nicotine gum yet.  He has only been taking in about two 12oz bottles of water a day.     Vitals:   06/30/16 1403  BP: 122/78  Pulse: 91  Temp: 98.3 F (36.8 C)   PE:  Gen: NAD [id number], incision healing well around umbilicus, minimal fibrinous material debrided away bluntly in 1cm area, inferior portion of wound with healing area of 2cm area with good healing granulation tissue.     A/P: Reviewed the smoking cessation again with patient, he stated that he was going to pick up the nicotine gum today.  Also discussed him increasing water intake to have about 2 very soft stools a day, and to use the colace and miralax for the next 3 days and then prn after that.  Will have him keep his f/u appointment next week.

## 2016-06-30 NOTE — Telephone Encounter (Signed)
Returned phone call to patient's caretaker at this time. She states that he has had a small amount of stool, the size of a quarter since Friday. Having pain right under the colostomy. Denies any Fever, Nausea, or Vomiting.  Stopped smoking yesterday at lunch time and is doing well with cravings.   Patient will come to office today at 1400 for evaluation from Dr. Azalee Course.

## 2016-07-07 NOTE — Telephone Encounter (Signed)
error 

## 2016-07-10 ENCOUNTER — Ambulatory Visit (INDEPENDENT_AMBULATORY_CARE_PROVIDER_SITE_OTHER): Payer: BLUE CROSS/BLUE SHIELD | Admitting: Surgery

## 2016-07-10 ENCOUNTER — Encounter: Payer: Self-pay | Admitting: Surgery

## 2016-07-10 VITALS — BP 111/78 | HR 86 | Temp 97.7°F | Ht 74.0 in | Wt 188.0 lb

## 2016-07-10 DIAGNOSIS — K5720 Diverticulitis of large intestine with perforation and abscess without bleeding: Secondary | ICD-10-CM

## 2016-07-10 NOTE — Patient Instructions (Signed)
We will see you back next month to check on your wound. However, if you need to see [place] before, please give [place] a call to reschedule your appointment.

## 2016-07-10 NOTE — Progress Notes (Signed)
55 yr old male status post Hartman's procedure for diverticulitis. The patient states is still smoking about 2 cigarettes a day and that the Wellbutrin seems to be helping. The patient is changing the inferior dressing is a wet-to-dry twice a day and the superior 1 as a dry dressing twice a day. Patient states that he's been eating well and having good bowel movements with the regimen of MiraLAX every other day. He would like to return to work this weekend if possible.  Vitals:   07/10/16 1406  BP: 111/78  Pulse: 86  Temp: 97.7 F (36.5 C)   PE:  Gen: NAD Abd: soft, incision healing well, 23mm area above umblicus minimal drainage, almost completely healed, inferior area 2cm in size, almost flush with skin, no erythema, ostomy pink and patent.     A/P:  Discussed smoking cessation with the patient again and asked him to continue his Wellbutrin. The patient was instructed to continue his MiraLAX every other day to get good bowel movements. He'll have him follow-up in about a month for a wound check and then will start preparations toward getting a colonoscopy and other studies. Discussed that we will be looking at reversing his ostomy likely around December.

## 2016-08-08 ENCOUNTER — Encounter: Payer: Self-pay | Admitting: Surgery

## 2016-08-08 ENCOUNTER — Ambulatory Visit (INDEPENDENT_AMBULATORY_CARE_PROVIDER_SITE_OTHER): Payer: BLUE CROSS/BLUE SHIELD | Admitting: Surgery

## 2016-08-08 VITALS — BP 118/76 | HR 87 | Temp 98.1°F | Ht 74.0 in | Wt 193.0 lb

## 2016-08-08 DIAGNOSIS — Z09 Encounter for follow-up examination after completed treatment for conditions other than malignant neoplasm: Secondary | ICD-10-CM

## 2016-08-08 DIAGNOSIS — Z933 Colostomy status: Secondary | ICD-10-CM

## 2016-08-08 MED ORDER — NA SULFATE-K SULFATE-MG SULF 17.5-3.13-1.6 GM/177ML PO SOLN: ORAL | 0 refills | Status: DC

## 2016-08-08 NOTE — Patient Instructions (Addendum)
We have scheduled your CT Scan for 10/08/16 at 0900. Arrive at 0845am.  You will need to pick up a prep kit at least 24 hours in advance of your Scan: You may pick this up at the [facility], or [employer]. Bring a list of medications with you to your appointment. If you have an allergy to IVP dye or CT contrast and this has not been addressed, please call our office [phone] and ask to speak with a nurse at least 48 hours in advance of your CT Scan. Nothing to eat or drink 4 hours prior to your CT Scan. If you need to reschedule your Scan, you may do so by calling [phone].  You will also have a Colonoscopy and Endoscopy on 10/09/16. Please see instructions provided.  You will follow-up in the office after this has all been completed. I will call you to make this appointment as our schedule is not out this far at this time.

## 2016-08-08 NOTE — Progress Notes (Signed)
S/p Hartman's for perforated diverticulitis and Laparotomy for evisceration. Now Doing very well, wound completely healed, colostomy working and back to baseline Continues to smoke about 5 cig /day he has cut down significantly after Wellbutrin was started Does have some heartburn  AVSS PE NAD Abd: soft, I do think that his Abdominal Wall Defect from the Lower Part Although It Is Difficult to Evaluate on a Physical Exam. His Colostomy Is Working and Is Well Matured. His Minilaparotomy Is Healing Well with Only a Small Area with a Scab. No Evidence of Infection.  A/p Doing well Plan for colostomy takedown in Dec Obtain EGD and colonoscopy in 2 months and we will also obtain CT scan A/P for preoperative planning, if he has loss of domain we may need to do a component separation at the same time. No surgical issues at this time F/U 2-3 months

## 2016-08-25 ENCOUNTER — Encounter: Payer: Self-pay | Admitting: Surgery

## 2016-10-02 ENCOUNTER — Telehealth: Payer: Self-pay

## 2016-10-02 ENCOUNTER — Other Ambulatory Visit: Payer: Self-pay

## 2016-10-02 DIAGNOSIS — K5730 Diverticulosis of large intestine without perforation or abscess without bleeding: Secondary | ICD-10-CM

## 2016-10-02 NOTE — Telephone Encounter (Signed)
Spoke with patient at this time. He informed me that his Suprep is $88.00 and would like something cheaper.  A Suprep sample has been placed in [place] at [facility] for patient pick-up.  Patient states that he will come by tomorrow around 2pm to pick this up.

## 2016-10-03 ENCOUNTER — Encounter: Payer: Self-pay | Admitting: *Deleted

## 2016-10-08 ENCOUNTER — Ambulatory Visit
Admission: RE | Admit: 2016-10-08 | Discharge: 2016-10-08 | Disposition: A | Discharge status: Home / Self Care | Payer: BLUE CROSS/BLUE SHIELD | Source: Ambulatory Visit | Attending: Surgery | Admitting: Surgery

## 2016-10-08 DIAGNOSIS — K760 Fatty (change of) liver, not elsewhere classified: Secondary | ICD-10-CM | POA: Insufficient documentation

## 2016-10-08 DIAGNOSIS — Z9049 Acquired absence of other specified parts of digestive tract: Secondary | ICD-10-CM | POA: Diagnosis not present

## 2016-10-08 DIAGNOSIS — M479 Spondylosis, unspecified: Secondary | ICD-10-CM | POA: Insufficient documentation

## 2016-10-08 DIAGNOSIS — Z933 Colostomy status: Secondary | ICD-10-CM | POA: Diagnosis present

## 2016-10-08 MED ORDER — IOPAMIDOL (ISOVUE-370) INJECTION 76%
100.0000 mL | Freq: Once | INTRAVENOUS | Status: AC | PRN
  Administered 2016-10-08: 100 mL via INTRAVENOUS

## 2016-10-08 NOTE — Discharge Instructions (Signed)

## 2016-10-09 ENCOUNTER — Ambulatory Visit
Admission: RE | Admit: 2016-10-09 | Discharge: 2016-10-09 | Disposition: A | Discharge status: Home / Self Care | Payer: BLUE CROSS/BLUE SHIELD | Source: Ambulatory Visit | Attending: Gastroenterology | Admitting: Gastroenterology

## 2016-10-09 ENCOUNTER — Encounter
Admission: RE | Disposition: A | Discharge status: Home / Self Care | Payer: Self-pay | Source: Ambulatory Visit | Attending: Gastroenterology

## 2016-10-09 ENCOUNTER — Ambulatory Visit: Payer: BLUE CROSS/BLUE SHIELD | Admitting: Student in an Organized Health Care Education/Training Program

## 2016-10-09 DIAGNOSIS — D120 Benign neoplasm of cecum: Secondary | ICD-10-CM

## 2016-10-09 DIAGNOSIS — K635 Polyp of colon: Secondary | ICD-10-CM

## 2016-10-09 DIAGNOSIS — K641 Second degree hemorrhoids: Secondary | ICD-10-CM | POA: Insufficient documentation

## 2016-10-09 DIAGNOSIS — D125 Benign neoplasm of sigmoid colon: Secondary | ICD-10-CM | POA: Diagnosis not present

## 2016-10-09 DIAGNOSIS — F17210 Nicotine dependence, cigarettes, uncomplicated: Secondary | ICD-10-CM | POA: Diagnosis not present

## 2016-10-09 DIAGNOSIS — Z79899 Other long term (current) drug therapy: Secondary | ICD-10-CM | POA: Diagnosis not present

## 2016-10-09 DIAGNOSIS — D123 Benign neoplasm of transverse colon: Secondary | ICD-10-CM

## 2016-10-09 DIAGNOSIS — Z933 Colostomy status: Secondary | ICD-10-CM | POA: Insufficient documentation

## 2016-10-09 DIAGNOSIS — D122 Benign neoplasm of ascending colon: Secondary | ICD-10-CM | POA: Diagnosis not present

## 2016-10-09 DIAGNOSIS — Z1211 Encounter for screening for malignant neoplasm of colon: Secondary | ICD-10-CM | POA: Diagnosis not present

## 2016-10-09 DIAGNOSIS — Z9049 Acquired absence of other specified parts of digestive tract: Secondary | ICD-10-CM | POA: Insufficient documentation

## 2016-10-09 HISTORY — DX: Presence of dental prosthetic device (complete) (partial): Z97.2

## 2016-10-09 HISTORY — PX: COLONOSCOPY WITH PROPOFOL: SHX5780

## 2016-10-09 HISTORY — PX: POLYPECTOMY: SHX5525

## 2016-10-09 SURGERY — COLONOSCOPY WITH PROPOFOL
Anesthesia: Monitor Anesthesia Care | Wound class: Contaminated

## 2016-10-09 MED ORDER — STERILE WATER FOR IRRIGATION IR SOLN
Status: DC | PRN
  Administered 2016-10-09: 08:00:00

## 2016-10-09 MED ORDER — PROPOFOL 10 MG/ML IV BOLUS
INTRAVENOUS | Status: DC | PRN
  Administered 2016-10-09: 30 mg via INTRAVENOUS
  Administered 2016-10-09: 20 mg via INTRAVENOUS
  Administered 2016-10-09: 10 mg via INTRAVENOUS
  Administered 2016-10-09 (×5): 20 mg via INTRAVENOUS
  Administered 2016-10-09: 10 mg via INTRAVENOUS
  Administered 2016-10-09 (×3): 20 mg via INTRAVENOUS
  Administered 2016-10-09: 70 mg via INTRAVENOUS
  Administered 2016-10-09 (×2): 20 mg via INTRAVENOUS
  Administered 2016-10-09: 10 mg via INTRAVENOUS
  Administered 2016-10-09 (×5): 20 mg via INTRAVENOUS

## 2016-10-09 MED ORDER — ACETAMINOPHEN 160 MG/5ML PO SOLN: 325.0000 mg | ORAL | Status: DC | PRN

## 2016-10-09 MED ORDER — LIDOCAINE HCL (CARDIAC) 20 MG/ML IV SOLN
INTRAVENOUS | Status: DC | PRN
  Administered 2016-10-09: 40 mg via INTRAVENOUS

## 2016-10-09 MED ORDER — SODIUM CHLORIDE 0.9 % IJ SOLN
INTRAMUSCULAR | Status: DC | PRN
  Administered 2016-10-09: 6 mL via INTRAVENOUS

## 2016-10-09 MED ORDER — ACETAMINOPHEN 325 MG PO TABS: 325.0000 mg | ORAL_TABLET | ORAL | Status: DC | PRN

## 2016-10-09 MED ORDER — LACTATED RINGERS IV SOLN
INTRAVENOUS | Status: DC
  Administered 2016-10-09: 07:00:00 via INTRAVENOUS

## 2016-10-09 SURGICAL SUPPLY — 23 items
CANISTER SUCT 1200ML W/VALVE (MISCELLANEOUS) ×3 IMPLANT
CLIP HMST 235XBRD CATH ROT (MISCELLANEOUS) ×1 IMPLANT
CLIP RESOLUTION 360 11X235 (MISCELLANEOUS) ×2
FCP ESCP3.2XJMB 240X2.8X (MISCELLANEOUS)
FORCEPS BIOP RAD 4 LRG CAP 4 (CUTTING FORCEPS) ×3 IMPLANT
FORCEPS BIOP RJ4 240 W/NDL (MISCELLANEOUS)
FORCEPS ESCP3.2XJMB 240X2.8X (MISCELLANEOUS) IMPLANT
GOWN CVR UNV OPN BCK APRN NK (MISCELLANEOUS) ×2 IMPLANT
GOWN ISOL THUMB LOOP REG UNIV (MISCELLANEOUS) ×4
INJECTOR VARIJECT VIN23 (MISCELLANEOUS) ×1 IMPLANT
KIT DEFENDO VALVE AND CONN (KITS) IMPLANT
KIT ENDO PROCEDURE OLY (KITS) ×3 IMPLANT
MARKER SPOT ENDO TATTOO 5ML (MISCELLANEOUS) IMPLANT
PAD GROUND ADULT SPLIT (MISCELLANEOUS) ×3 IMPLANT
PROBE APC STR FIRE (PROBE) IMPLANT
RETRIEVER NET ROTH 2.5X230 LF (MISCELLANEOUS) IMPLANT
SNARE SHORT THROW 13M SML OVAL (MISCELLANEOUS) ×3 IMPLANT
SNARE SHORT THROW 30M LRG OVAL (MISCELLANEOUS) IMPLANT
SNARE SNG USE RND 15MM (INSTRUMENTS) IMPLANT
SPOT EX ENDOSCOPIC TATTOO (MISCELLANEOUS)
TRAP ETRAP POLY (MISCELLANEOUS) ×3 IMPLANT
VARIJECT INJECTOR VIN23 (MISCELLANEOUS) ×3
WATER STERILE IRR 250ML POUR (IV SOLUTION) ×3 IMPLANT

## 2016-10-09 NOTE — H&P (Signed)
Lucilla Lame, MD [facility] [address] [place], [place] [address] Phone: [phone] Fax : [phone]  Primary Care Physician:  Maryland Pink, MD Primary Gastroenterologist:  Dr. Allen Norris  Pre-Procedure History & Physical: HPI:  Clayton Butler is a 55 y.o. male is here for an colonoscopy.   Past Medical History:  Diagnosis Date  . Cervical radiculitis 10/20/2014  . Diverticulitis of colon with perforation 05/20/2016  . Wears dentures    full upper    Past Surgical History:  Procedure Laterality Date  . COLECTOMY WITH COLOSTOMY CREATION/HARTMANN PROCEDURE N/A 05/22/2016   Procedure: COLECTOMY WITH COLOSTOMY CREATION/HARTMANN PROCEDURE;  Surgeon: Jules Husbands, MD;  Location: [facility] ORS;  Service: General;  Laterality: N/A;  . [place]  . LAPAROTOMY N/A 05/22/2016   Procedure: EXPLORATORY LAPAROTOMY;  Surgeon: Jules Husbands, MD;  Location: [facility] ORS;  Service: General;  Laterality: N/A;  . LAPAROTOMY N/A 05/27/2016   Procedure: EXPLORATORY LAPAROTOMY;  Surgeon: Hubbard Robinson, MD;  Location: [facility] ORS;  Service: General;  Laterality: N/A;    Prior to Admission medications   Medication Sig Start Date End Date Taking? Authorizing Provider  buPROPion (WELLBUTRIN SR) 150 MG 12 hr tablet Take 1 tablet (150 mg total) by mouth See admin instructions. Take 1 tablet by mouth for first 3 days, then increase to twice daily. 06/18/16  Yes Hubbard Robinson, MD  polyethylene glycol (MIRALAX / GLYCOLAX) packet Take 17 g by mouth daily. 06/30/16  Yes Hubbard Robinson, MD  docusate sodium (COLACE) 100 MG capsule Take 1 capsule (100 mg total) by mouth 2 (two) times daily. Patient not taking: Reported on 10/03/2016 06/30/16   Hubbard Robinson, MD  Na Sulfate-K Sulfate-Mg Sulf (SUPREP BOWEL PREP KIT) 17.5-3.13-1.6 GM/180ML SOLN Per instructions given at appt 08/08/16   Jules Husbands, MD    Allergies as of 10/02/2016 - Review Complete 08/08/2016  Allergen Reaction Noted  . Morphine  and related Other (See Comments) 05/27/2016    Family History  Problem Relation Age of Onset  . Stroke Mother     Social History   Social History  . Marital status: Single    Spouse name: N/A  . Number of children: N/A  . Years of education: N/A   Occupational History  . Not on file.   Social History Main Topics  . Smoking status: Current Every Day Smoker    Packs/day: 0.25    Years: 30.00    Types: Cigarettes  . Smokeless tobacco: Never Used     Comment: Patient currently on Wellbutrin  . Alcohol use 6.0 oz/week    10 Cans of beer per week     Comment:    . Drug use: No  . Sexual activity: Not on file   Other Topics Concern  . Not on file   Social History Narrative  . No narrative on file    Review of Systems: See HPI, otherwise negative ROS  Physical Exam: BP (!) 141/79   Pulse 82   Temp 97.5 F (36.4 C) (Temporal)   Ht _0  (1.88 m)   Wt 202 lb (91.6 kg)   SpO2 100%   BMI 25.94 kg/m  General:   Alert,  pleasant and cooperative in NAD Head:  Normocephalic and atraumatic. Neck:  Supple; no masses or thyromegaly. Lungs:  Clear throughout to auscultation.    Heart:  Regular rate and rhythm. Abdomen:  Soft, nontender and nondistended. Normal bowel sounds, without guarding, and without rebound.  Neurologic:  Alert and  oriented x4;  grossly normal neurologically.  Impression/Plan: Clayton Butler is here for an colonoscopy to be performed for evaluation before coloostomy reversal  Risks, benefits, limitations, and alternatives regarding  colonoscopy have been reviewed with the patient.  Questions have been answered.  All parties agreeable.   Lucilla Lame, MD  10/09/2016, 7:38 AM

## 2016-10-09 NOTE — Anesthesia Procedure Notes (Signed)
Procedure Name: MAC Performed by: Thera Basden Pre-anesthesia Checklist: Patient identified, Emergency Drugs available, Suction available, Timeout performed and Patient being monitored Patient Re-evaluated:Patient Re-evaluated prior to inductionOxygen Delivery Method: Nasal cannula Placement Confirmation: positive ETCO2       

## 2016-10-09 NOTE — Anesthesia Postprocedure Evaluation (Signed)
Anesthesia Post Note  Patient: Clayton Butler  Procedure(s) Performed: Procedure(s) (LRB): COLONOSCOPY WITH PROPOFOL (N/A) POLYPECTOMY, 8 colon polyps (N/A)  Patient location during evaluation: PACU Anesthesia Type: MAC Level of consciousness: awake and alert and oriented Pain management: satisfactory to patient Vital Signs Assessment: post-procedure vital signs reviewed and stable Respiratory status: spontaneous breathing, nonlabored ventilation and respiratory function stable Cardiovascular status: blood pressure returned to baseline and stable Postop Assessment: Adequate PO intake and No signs of nausea or vomiting Anesthetic complications: no    Raliegh Ip

## 2016-10-09 NOTE — Op Note (Signed)
[facility] Patient Name: Clayton Butler Procedure Date: 10/09/2016 7:58 AM MRN: [id number] Account #: [id number] Date of Birth: 26-Oct-1961 Admit Type: Outpatient Age: 55 Room: [facility] OR ROOM 01 Gender: Male Note Status: Finalized Procedure:            Colonoscopy Indications:          Screening for colorectal malignant neoplasm Providers:            Lucilla Lame MD, MD Referring MD:         Susa Griffins (Referring MD) Medicines:            Propofol per Anesthesia Complications:        No immediate complications. Procedure:            Pre-Anesthesia Assessment:                       - Prior to the procedure, a History and Physical was                        performed, and patient medications and allergies were                        reviewed. The patient's tolerance of previous                        anesthesia was also reviewed. The risks and benefits of                        the procedure and the sedation options and risks were                        discussed with the patient. All questions were                        answered, and informed consent was obtained. Prior                        Anticoagulants: The patient has taken no previous                        anticoagulant or antiplatelet agents. ASA Grade                        Assessment: II - A patient with mild systemic disease.                        After reviewing the risks and benefits, the patient was                        deemed in satisfactory condition to undergo the                        procedure.                       After obtaining informed consent, the colonoscope was                        passed under direct vision. Throughout the procedure,  the patient's blood pressure, pulse, and oxygen                        saturations were monitored continuously. The Olympus CF                        H180AL colonoscope (S#: [id number]) was introduced  through                        the descending colostomy and advanced to the the cecum,                        identified by appendiceal orifice and ileocecal valve.                        The colonoscopy was performed without difficulty. The                        patient tolerated the procedure well. The quality of                        the bowel preparation was good. Findings:      The perianal and digital rectal examinations were normal.      A 4 mm polyp was found in the ileocecal valve. The polyp was sessile.       The polyp was removed with a cold biopsy forceps. Resection and       retrieval were complete.      A 15 mm polyp was found in the ascending colon. The polyp was sessile.       Area was successfully injected with 6 mL saline for a lift polypectomy.       The polyp was removed with a hot snare. Resection and retrieval were       complete.      A 4 mm polyp was found in the transverse colon. The polyp was sessile.      Five semi-pedunculated polyps were found in the sigmoid colon. The       polyps were 2 to 7 mm in size. These polyps were removed with a cold       snare. Resection and retrieval were complete.      Non-bleeding internal hemorrhoids were found during retroflexion. The       hemorrhoids were Grade II (internal hemorrhoids that prolapse but reduce       spontaneously).      The scope was then inserted into the anus and the [facility] pouch was       inspected. Impression:           - One 4 mm polyp at the ileocecal valve, removed with a                        cold biopsy forceps. Resected and retrieved.                       - One 15 mm polyp in the ascending colon, removed with                        a hot snare. Resected and retrieved. Injected.                       -  One 4 mm polyp in the transverse colon, removed with                        a cold biopsy forceps. Resected and retrieved.                       - Five 2 to 7 mm polyps in the sigmoid colon,  removed                        with a cold snare. Resected and retrieved.                       - Non-bleeding internal hemorrhoids.                       - The scope was then inserted into the anus and the                        [facility] pouch was inspected. Recommendation:       - Discharge patient to home.                       - Resume previous diet.                       - Continue present medications.                       - Await pathology results. Procedure Code(s):    --- Professional ---                       [phone], Colonoscopy through stoma; with removal of                        tumor(s), polyp(s), or other lesion(s) by snare                        technique                       [id number], 81, Colonoscopy through stoma; with biopsy,                        single or multiple                       [id number], Colonoscopy through stoma; with directed                        submucosal injection(s), any substance Diagnosis Code(s):    --- Professional ---                       Z12.11, Encounter for screening for malignant neoplasm                        of colon                       D12.0, Benign neoplasm of cecum                       D12.2, Benign neoplasm of ascending colon  D12.3, Benign neoplasm of transverse colon (hepatic                        flexure or splenic flexure)                       D12.5, Benign neoplasm of sigmoid colon CPT copyright 2016 American Medical Association. All rights reserved. The codes documented in this report are preliminary and upon coder review may  be revised to meet current compliance requirements. Lucilla Lame MD, MD 10/09/2016 8:38:31 AM This report has been signed electronically. Number of Addenda: 0 Note Initiated On: 10/09/2016 7:58 AM Scope Withdrawal Time: 0 hours 18 minutes 33 seconds  Total Procedure Duration: 0 hours 21 minutes 16 seconds       [facility]

## 2016-10-09 NOTE — Transfer of Care (Signed)
Immediate Anesthesia Transfer of Care Note  Patient: Clayton Butler  Procedure(s) Performed: Procedure(s): COLONOSCOPY WITH PROPOFOL (N/A)  Patient Location: PACU  Anesthesia Type: MAC  Level of Consciousness: awake, alert  and patient cooperative  Airway and Oxygen Therapy: Patient Spontanous Breathing and Patient connected to supplemental oxygen  Post-op Assessment: Post-op Vital signs reviewed, Patient's Cardiovascular Status Stable, Respiratory Function Stable, Patent Airway and No signs of Nausea or vomiting  Post-op Vital Signs: Reviewed and stable  Complications: No apparent anesthesia complications

## 2016-10-09 NOTE — Anesthesia Preprocedure Evaluation (Signed)
Anesthesia Evaluation  Patient identified by MRN, date of birth, ID band Patient awake    Reviewed: Allergy & Precautions, H&P , NPO status , Patient's Chart, lab work & pertinent test results  Airway Mallampati: III  TM Distance: >3 FB Neck ROM: full    Dental  (+) Upper Dentures, Missing   Pulmonary Current Smoker,    Pulmonary exam normal        Cardiovascular Normal cardiovascular exam     Neuro/Psych  Neuromuscular disease    GI/Hepatic   Endo/Other    Renal/GU      Musculoskeletal   Abdominal   Peds  Hematology   Anesthesia Other Findings   Reproductive/Obstetrics                             Anesthesia Physical Anesthesia Plan  ASA: II  Anesthesia Plan: MAC   Post-op Pain Management:    Induction:   Airway Management Planned:   Additional Equipment:   Intra-op Plan:   Post-operative Plan:   Informed Consent: I have reviewed the patients History and Physical, chart, labs and discussed the procedure including the risks, benefits and alternatives for the proposed anesthesia with the patient or authorized representative who has indicated his/her understanding and acceptance.     Plan Discussed with:   Anesthesia Plan Comments:         Anesthesia Quick Evaluation

## 2016-10-10 ENCOUNTER — Encounter: Payer: Self-pay | Admitting: Gastroenterology

## 2016-10-13 ENCOUNTER — Encounter: Payer: Self-pay | Admitting: Gastroenterology

## 2016-11-03 ENCOUNTER — Telehealth: Payer: Self-pay | Admitting: Surgery

## 2016-11-03 NOTE — Telephone Encounter (Signed)
Patient called and wanted to know if you got results back yet and when can he schedule his surgery?

## 2016-11-04 NOTE — Telephone Encounter (Signed)
Please schedule patient a follow-up with Dr. Azalee Course to discuss Colostomy Takedown.

## 2016-11-05 NOTE — Telephone Encounter (Signed)
Scheduled appointment 11/30 with Dr. Azalee Course

## 2016-11-06 ENCOUNTER — Encounter: Payer: Self-pay | Admitting: Surgery

## 2016-11-06 ENCOUNTER — Ambulatory Visit (INDEPENDENT_AMBULATORY_CARE_PROVIDER_SITE_OTHER): Payer: BLUE CROSS/BLUE SHIELD | Admitting: Surgery

## 2016-11-06 VITALS — BP 153/95 | HR 86 | Temp 98.7°F | Ht 74.0 in | Wt 209.0 lb

## 2016-11-06 DIAGNOSIS — K432 Incisional hernia without obstruction or gangrene: Secondary | ICD-10-CM

## 2016-11-06 DIAGNOSIS — K5720 Diverticulitis of large intestine with perforation and abscess without bleeding: Secondary | ICD-10-CM

## 2016-11-06 MED ORDER — BISACODYL 5 MG PO TBEC: 20.0000 mg | DELAYED_RELEASE_TABLET | Freq: Once | ORAL | 0 refills | Status: AC

## 2016-11-06 MED ORDER — NEOMYCIN SULFATE 500 MG PO TABS: 1000.0000 mg | ORAL_TABLET | Freq: Three times a day (TID) | ORAL | 0 refills | Status: DC

## 2016-11-06 MED ORDER — FLEET ENEMA 7-19 GM/118ML RE ENEM: 1.0000 | ENEMA | Freq: Once | RECTAL | 0 refills | Status: AC

## 2016-11-06 MED ORDER — POLYETHYLENE GLYCOL 3350 17 GM/SCOOP PO POWD: 1.0000 | Freq: Once | ORAL | 0 refills | Status: AC

## 2016-11-06 MED ORDER — ERYTHROMYCIN BASE 500 MG PO TABS: 1000.0000 mg | ORAL_TABLET | Freq: Three times a day (TID) | ORAL | 0 refills | Status: DC

## 2016-11-06 NOTE — Patient Instructions (Addendum)
We have seen you today to speak about reversing your Colostomy and repairing your incisional Hernia. We will arrange for this surgery to be done on 12/24/16 at [facility] by Dr. Azalee Course.  You will need to complete a bowel prep prior to your surgery, please see the information sheet provided today for your directions.  Also, there will be 2 different antibiotics that you will need to take the day of your bowel prep: Neomycin and Erythromycin. You will take 2 tablets of each medication 3 times on the day of your bowel prep- 8am, 2pm, and 8pm.  Please see the (blue) Pre-care sheet for the details about your scheduled surgery.  Make sure that you QUIT SMOKING today for surgery scheduled 12/24/16 to help with healing after surgery. Please see tips/tricks below.   Steps to Quit Smoking Smoking tobacco can be harmful to your health and can affect almost every organ in your body. Smoking puts you, and those around you, at risk for developing many serious chronic diseases. Quitting smoking is difficult, but it is one of the best things that you can do for your health. It is never too late to quit. What are the benefits of quitting smoking? When you quit smoking, you lower your risk of developing serious diseases and conditions, such as:  Lung cancer or lung disease, such as COPD.  Heart disease.  Stroke.  Heart attack.  Infertility.  Osteoporosis and bone fractures. Additionally, symptoms such as coughing, wheezing, and shortness of breath may get better when you quit. You may also find that you get sick less often because your body is stronger at fighting off colds and infections. If you are pregnant, quitting smoking can help to reduce your chances of having a baby of low birth weight. How do I get ready to quit? When you decide to quit smoking, create a plan to make sure that you are successful. Before you quit:  Pick a date to quit. Set a date within the next two weeks to give you time to  prepare.  Write down the reasons why you are quitting. Keep this list in places where you will see it often, such as on your bathroom mirror or in your car or wallet.  Identify the people, places, things, and activities that make you want to smoke (triggers) and avoid them. Make sure to take these actions:  Throw away all cigarettes at home, at work, and in your car.  Throw away smoking accessories, such as Scientist, research (medical).  Clean your car and make sure to empty the ashtray.  Clean your home, including curtains and carpets.  Tell your family, friends, and coworkers that you are quitting. Support from your loved ones can make quitting easier.  Talk with your health care provider about your options for quitting smoking.  Find out what treatment options are covered by your health insurance. What strategies can I use to quit smoking? Talk with your healthcare provider about different strategies to quit smoking. Some strategies include:  Quitting smoking altogether instead of gradually lessening how much you smoke over a period of time. Research shows that quitting "cold [place]" is more successful than gradually quitting.  Attending in-person counseling to help you build problem-solving skills. You are more likely to have success in quitting if you attend several counseling sessions. Even short sessions of 10 minutes can be effective.  Finding resources and support systems that can help you to quit smoking and remain smoke-free after you quit. These resources are  most helpful when you use them often. They can include:  Online chats with a Social worker.  Telephone quitlines.  Printed Furniture conservator/restorer.  Support groups or group counseling.  Text messaging programs.  Mobile phone applications.  Taking medicines to help you quit smoking. (If you are pregnant or breastfeeding, talk with your health care provider first.) Some medicines contain nicotine and some do not. Both types of  medicines help with cravings, but the medicines that include nicotine help to relieve withdrawal symptoms. Your health care provider may recommend:  Nicotine patches, gum, or lozenges.  Nicotine inhalers or sprays.  Non-nicotine medicine that is taken by mouth. Talk with your health care provider about combining strategies, such as taking medicines while you are also receiving in-person counseling. Using these two strategies together makes you more likely to succeed in quitting than if you used either strategy on its own. If you are pregnant or breastfeeding, talk with your health care provider about finding counseling or other support strategies to quit smoking. Do not take medicine to help you quit smoking unless told to do so by your health care provider. What things can I do to make it easier to quit? Quitting smoking might feel overwhelming at first, but there is a lot that you can do to make it easier. Take these important actions:  Reach out to your family and friends and ask that they support and encourage you during this time. Call telephone quitlines, reach out to support groups, or work with a counselor for support.  Ask people who smoke to avoid smoking around you.  Avoid places that trigger you to smoke, such as bars, parties, or smoke-break areas at work.  Spend time around people who do not smoke.  Lessen stress in your life, because stress can be a smoking trigger for some people. To lessen stress, try:  Exercising regularly.  Deep-breathing exercises.  Yoga.  Meditating.  Performing a body scan. This involves closing your eyes, scanning your body from head to toe, and noticing which parts of your body are particularly tense. Purposefully relax the muscles in those areas.  Download or purchase mobile phone or tablet apps (applications) that can help you stick to your quit plan by providing reminders, tips, and encouragement. There are many free apps, such as QuitGuide  from the [employer] Office manager for Disease Control and Prevention). You can find other support for quitting smoking (smoking cessation) through smokefree.gov and other websites. How will I feel when I quit smoking? Within the first 24 hours of quitting smoking, you may start to feel some withdrawal symptoms. These symptoms are usually most noticeable 2-3 days after quitting, but they usually do not last beyond 2-3 weeks. Changes or symptoms that you might experience include:  Mood swings.  Restlessness, anxiety, or irritation.  Difficulty concentrating.  Dizziness.  Strong cravings for sugary foods in addition to nicotine.  Mild weight gain.  Constipation.  Nausea.  Coughing or a sore throat.  Changes in how your medicines work in your body.  A depressed mood.  Difficulty sleeping (insomnia). After the first 2-3 weeks of quitting, you may start to notice more positive results, such as:  Improved sense of smell and taste.  Decreased coughing and sore throat.  Slower heart rate.  Lower blood pressure.  Clearer skin.  The ability to breathe more easily.  Fewer sick days. Quitting smoking is very challenging for most people. Do not get discouraged if you are not successful the first time. Some people  need to make many attempts to quit before they achieve long-term success. Do your best to stick to your quit plan, and talk with your health care provider if you have any questions or concerns. This information is not intended to replace advice given to you by your health care provider. Make sure you discuss any questions you have with your health care provider. Document Released: 11/18/2001 Document Revised: 07/22/2016 Document Reviewed: 04/10/2015 Elsevier Interactive Patient Education  2017 [employer].

## 2016-11-06 NOTE — Progress Notes (Signed)
Subjective:     Patient ID: Clayton Butler, male   DOB: 19-Mar-1961, 55 y.o.   MRN: [id number]  HPI  55 year old male who had a perforated diverticulitis and a Hartman's procedure performed back in June as well as a dehiscence and then another abdominal wall repair.  The patient is doing well at this time. He has been back to work and been golfing and exercising. The patient has been able to cut back smoking with the Wellbutrin to 1-2 cigarettes every other day. The patient states he drinks 1-2 beers a day and has a hard time not smoking when he drinks beer. He is having some tenderness in the lower part of his midline incision whenever he coughs, this correlates with the area that had to be packed to heal. Otherwise the patient is doing well and having normal bowel movements from the colostomy that are soft and easy to pass. Patient states that if he feels he is becoming constipated at all he will take some MiraLAX every other day in his coffee. He has significantly increase his water intake and is doing that as a regular basis now and increase his fiber intake in his diet as well. The patient has had a cough and some sinus congestion and however states that this is getting better. He has not had any fevers, chills, shortness of breath, chest pain, nausea, vomiting, constipation, diarrhea or dysuria.  Past Medical History:  Diagnosis Date  . Cervical radiculitis 10/20/2014  . Diverticulitis of colon with perforation 05/20/2016  . Wears dentures    full upper   Past Surgical History:  Procedure Laterality Date  . COLECTOMY WITH COLOSTOMY CREATION/HARTMANN PROCEDURE N/A 05/22/2016   Procedure: COLECTOMY WITH COLOSTOMY CREATION/HARTMANN PROCEDURE;  Surgeon: Jules Husbands, MD;  Location: [facility];  Service: General;  Laterality: N/A;  . COLONOSCOPY WITH PROPOFOL N/A 10/09/2016   Procedure: COLONOSCOPY WITH PROPOFOL;  Surgeon: Lucilla Lame, MD;  Location: [place];  Service: Endoscopy;   Laterality: N/A;  . HAND SURGERY  1987  . LAPAROTOMY N/A 05/22/2016   Procedure: EXPLORATORY LAPAROTOMY;  Surgeon: Jules Husbands, MD;  Location: [facility];  Service: General;  Laterality: N/A;  . LAPAROTOMY N/A 05/27/2016   Procedure: EXPLORATORY LAPAROTOMY;  Surgeon: Hubbard Robinson, MD;  Location: [facility];  Service: General;  Laterality: N/A;  . POLYPECTOMY N/A 10/09/2016   Procedure: POLYPECTOMY, 8 colon polyps;  Surgeon: Lucilla Lame, MD;  Location: [place];  Service: Endoscopy;  Laterality: N/A;   Family History  Problem Relation Age of Onset  . Stroke Mother    Social History   Social History  . Marital status: Single    Spouse name: N/A  . Number of children: N/A  . Years of education: N/A   Social History Main Topics  . Smoking status: Current Some Day Smoker    Years: 30.00    Types: Cigarettes  . Smokeless tobacco: Never Used     Comment: 1-2 Cigarettes every 2-3 days  . Alcohol use 6.0 oz/week    10 Cans of beer per week     Comment:    . Drug use: No  . Sexual activity: Not Asked   Other Topics Concern  . None   Social History Narrative  . None    Current Outpatient Prescriptions:  .  buPROPion (WELLBUTRIN SR) 150 MG 12 hr tablet, Take 1 tablet (150 mg total) by mouth See admin instructions. Take 1 tablet by mouth for first 3 days,  then increase to twice daily., Disp: 63 tablet, Rfl: 2 .  bisacodyl (DULCOLAX) 5 MG EC tablet, Take 4 tablets (20 mg total) by mouth once., Disp: 4 tablet, Rfl: 0 .  erythromycin base (E-MYCIN) 500 MG tablet, Take 2 tablets (1,000 mg total) by mouth 3 (three) times daily., Disp: 6 tablet, Rfl: 0 .  neomycin (MYCIFRADIN) 500 MG tablet, Take 2 tablets (1,000 mg total) by mouth 3 (three) times daily., Disp: 6 tablet, Rfl: 0 .  polyethylene glycol powder (GLYCOLAX/MIRALAX) powder, Take 255 g by mouth once., Disp: 255 g, Rfl: 0 .  sodium phosphate (FLEET) 7-19 GM/118ML ENEM, Place 133 mLs (1 enema total) rectally once., Disp:  1 Bottle, Rfl: 0 Allergies  Allergen Reactions  . Morphine And Related Other (See Comments)    Syncope    Review of Systems  Constitutional: Negative for activity change, appetite change, chills, diaphoresis, fatigue and fever.  HENT: Positive for congestion, sinus pressure and sore throat.   Respiratory: Positive for cough. Negative for choking, chest tightness, shortness of breath and stridor.   Cardiovascular: Negative for chest pain and leg swelling.  Gastrointestinal: Positive for abdominal distention and abdominal pain. Negative for constipation, diarrhea, nausea and vomiting.  Genitourinary: Negative for dysuria and hematuria.  Musculoskeletal: Negative for back pain and neck pain.  Skin: Negative for color change, pallor, rash and wound.  Neurological: Negative for dizziness and weakness.  Hematological: Negative for adenopathy. Does not bruise/bleed easily.  Psychiatric/Behavioral: Negative for agitation. The patient is not nervous/anxious.   All other systems reviewed and are negative.      Vitals:   11/06/16 1448  BP: (!) 153/95  Pulse: 86  Temp: 98.7 F (37.1 C)    Objective:   Physical Exam  Constitutional: He is oriented to person, place, and time. He appears well-developed and well-nourished. No distress.  HENT:  Head: Normocephalic and atraumatic.  Right Ear: External ear normal.  Left Ear: External ear normal.  Nose: Nose normal.  Mouth/Throat: Oropharynx is clear and moist. No oropharyngeal exudate.  Eyes: Conjunctivae and EOM are normal. Pupils are equal, round, and reactive to light. No scleral icterus.  Neck: Normal range of motion. Neck supple. No tracheal deviation present.  Cardiovascular: Normal rate, regular rhythm, normal heart sounds and intact distal pulses.  Exam reveals no gallop and no friction rub.   No murmur heard. Pulmonary/Chest: Effort normal and breath sounds normal. No respiratory distress. He has no wheezes. He has no rales.   Abdominal: Soft. Bowel sounds are normal. He exhibits no distension. There is tenderness. There is no rebound and no guarding.  Midline incision well-healed,  with coughing and straining a small 3 cm hernia defect in the inferior portion below the ostomy site, ostomy is pink patent with air in the back.  Musculoskeletal: Normal range of motion. He exhibits no edema, tenderness or deformity.  Neurological: He is alert and oriented to person, place, and time. No cranial nerve deficit.  Skin: Skin is warm and dry. No rash noted. No erythema. No pallor.  Psychiatric: He has a normal mood and affect. His behavior is normal. Judgment and thought content normal.  Vitals reviewed.      CBC Latest Ref Rng & Units 05/31/2016 05/26/2016 05/25/2016  WBC 3.8 - 10.6 K/uL 9.7 7.5 6.6  Hemoglobin 13.0 - 18.0 g/dL 11.0(L) 12.1(L) 11.7(L)  Hematocrit 40.0 - 52.0 % 31.6(L) 34.8(L) 33.8(L)  Platelets 150 - 440 K/uL 259 179 173   CMP Latest Ref Rng &  Units 05/29/2016 05/28/2016 05/27/2016  Glucose 65 - 99 mg/dL 105(H) 130(H) 110(H)  BUN 6 - 20 mg/dL 12 10 10   Creatinine 0.61 - 1.24 mg/dL 0.86 0.82 0.89  Sodium 135 - 145 mmol/L 136 135 137  Potassium 3.5 - 5.1 mmol/L 3.9 3.9 3.6  Chloride 101 - 111 mmol/L 104 104 106  CO2 22 - 32 mmol/L 26 26 28   Calcium 8.9 - 10.3 mg/dL 7.5(L) 7.8(L) 7.8(L)  Total Protein 6.5 - 8.1 g/dL - - -  Total Bilirubin 0.3 - 1.2 mg/dL - - -  Alkaline Phos 38 - 126 U/L - - -  AST 15 - 41 U/L - - -  ALT 17 - 63 U/L - - -   CT scan:  IMPRESSION: 1. Mild fatty infiltration of the liver. 2. No small bowel obstruction. 3. Status post sigmoidectomy. Colostomy noted in left lower quadrant. The colostomy is patent. Moderate stool noted in right colon and transverse colon. Low lying cecum. No pericecal inflammation. 4. No hydronephrosis or hydroureter. 5. Mild degenerative changes thoracolumbar spine. Electronically Signed   By: Lahoma Crocker M.D.   On: 10/08/2016 10:07  Colonscopy   Dr. Allen Norris, 10/09/2016: - One 4 mm polyp at the ileocecal valve, removed with a cold biopsy forceps. Resected and retrieved. - One 15 mm polyp in the ascending colon, removed with a hot snare. Resected and retrieved. Injected. - One 4 mm polyp in the transverse colon, removed with a cold biopsy forceps. Resected and retrieved. - Five 2 to 7 mm polyps in the sigmoid colon, removed with a cold snare. Resected and retrieved. - Non-bleeding internal hemorrhoids. - The scope was then inserted into the anus and the [facility] pouch was inspected.  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 I have personally reviewed his past laboratory values which have not had to be performed since his time in the hospital 6 months prior. I personally reviewed his CT scan images which do show normal-appearing bowel and a good colostomy but with a weakness in the abdominal wall fascia of about 3 cm in the inferior portion of his wound. I personally reviewed his colonoscopy results which did reveal a different polyps all which were tubular adenomas or hyperplastic polyps without dysplasia and he understands that he needs another one in a year.    We discussed extensively his smoking cessation and that he would need to be stopped smoking for 4 weeks prior to the operation to give him the best chance of healing and anastomosis of the colon. The patient states that he understands this and will continue with the Wellbutrin. The patient stated today that today would  be the last day he had smoked and he will not smoke anymore until the date of that surgery.  I discussed the risks, benefits, alternatives, and expected outcomes of the colostomy reversal with the patient. I discussed with him that due to the extensive scarring in the fact that he had a dehiscence last time that we would need to do an open  repair and could not be done laparoscopically. I discussed with him the risk involved including bleeding, infection of the wound as well as an abscess,  scar tissue to the bowel, injury to the small or large bowel from scar tissue, needing to remove a further portion of the colon to remove all of the diverticular diseased portion, injury to the ureters requiring repair and stent placement, need for drain placement of the surgery, need for another ostomy placement, need for a possible potential ileostomy placement, possible need for further procedures, possible need for a blood transfusion, additionally with his hernia discussed the risk of potentially needing to perform an abdominal component separation were re-takedown different layers of the abdominal wall to reconstruct as well as potential for putting a biologic mesh in to give a strong closure of the abdomen. I also discussed anesthetic risks to include heart attack, stroke, blood clots in the legs and lungs and potentially death. I also discussed today approximately 5-7 day stay in the hospital if all goes well and need to stay out of work for approximately 3 weeks afterwards. The patient was given opportunity to ask questions and have them answered and he is in agreement with the plan as above.  I will plan for an open colostomy reversal with potential ventral hernia repair and possible ostomy placement as well as urologic ureteral stent placements prior to the operation likely with Dr. Dahlia Byes to assist in the surgery as well on January 17th.

## 2016-11-07 ENCOUNTER — Telehealth: Payer: Self-pay | Admitting: Surgery

## 2016-11-07 NOTE — Telephone Encounter (Signed)
Pt advised of pre op date/time and sx date. Sx: 12/24/16 with Dr Pearlie Oyster Dahlia Byes assisting--open colostomy takedown with incisional hernia repair.  Pre op: 12/17/16 @ 9:45am--Office.   Patient made aware to call [phone], between 1-3:00pm the day before surgery, to find out what time to arrive.     Patient advised of the physician estimate of 2315.22. Patient has agreed to make a pmt of 1000.00 prior to surgery.

## 2016-11-19 ENCOUNTER — Telehealth: Payer: Self-pay | Admitting: Surgery

## 2016-11-19 MED ORDER — BUPROPION HCL ER (SR) 150 MG PO TB12: 150.0000 mg | ORAL_TABLET | Freq: Two times a day (BID) | ORAL | 2 refills | Status: DC

## 2016-11-19 NOTE — Telephone Encounter (Signed)
Called patient back to let him know that his refill request have been sent to his pharmacy. Patient had no further questions.

## 2016-11-19 NOTE — Telephone Encounter (Signed)
[facility] Patient needs a refill on Wellbutrin. He thought Dr. Azalee Course wanted him to continue on it and when he went to get it filled he didn't have any refills. Please call

## 2016-12-15 ENCOUNTER — Other Ambulatory Visit: Payer: Self-pay | Admitting: Radiology

## 2016-12-17 ENCOUNTER — Encounter
Admission: RE | Admit: 2016-12-17 | Discharge: 2016-12-17 | Disposition: A | Discharge status: Home / Self Care | Payer: BLUE CROSS/BLUE SHIELD | Source: Ambulatory Visit | Attending: Surgery | Admitting: Surgery

## 2016-12-17 ENCOUNTER — Telehealth: Payer: Self-pay

## 2016-12-17 DIAGNOSIS — Z01812 Encounter for preprocedural laboratory examination: Secondary | ICD-10-CM | POA: Diagnosis not present

## 2016-12-17 DIAGNOSIS — Z01810 Encounter for preprocedural cardiovascular examination: Secondary | ICD-10-CM | POA: Insufficient documentation

## 2016-12-17 LAB — CBC WITH DIFFERENTIAL/PLATELET
BASOS ABS: 0.1 10*3/uL (ref 0–0.1)
BASOS PCT: 1 %
EOS PCT: 1 %
Eosinophils Absolute: 0.1 10*3/uL (ref 0–0.7)
HCT: 45.4 % (ref 40.0–52.0)
Hemoglobin: 15.7 g/dL (ref 13.0–18.0)
LYMPHS PCT: 31 %
Lymphs Abs: 2.6 10*3/uL (ref 1.0–3.6)
MCH: 34 pg (ref 26.0–34.0)
MCHC: 34.5 g/dL (ref 32.0–36.0)
MCV: 98.5 fL (ref 80.0–100.0)
Monocytes Absolute: 0.6 10*3/uL (ref 0.2–1.0)
Monocytes Relative: 7 %
NEUTROS ABS: 5 10*3/uL (ref 1.4–6.5)
Neutrophils Relative %: 60 %
PLATELETS: 214 10*3/uL (ref 150–440)
RBC: 4.61 MIL/uL (ref 4.40–5.90)
RDW: 14 % (ref 11.5–14.5)
WBC: 8.5 10*3/uL (ref 3.8–10.6)

## 2016-12-17 LAB — COMPREHENSIVE METABOLIC PANEL
ALBUMIN: 4.3 g/dL (ref 3.5–5.0)
ALT: 15 U/L — AB (ref 17–63)
AST: 21 U/L (ref 15–41)
Alkaline Phosphatase: 65 U/L (ref 38–126)
Anion gap: 8 (ref 5–15)
BUN: 11 mg/dL (ref 6–20)
CHLORIDE: 104 mmol/L (ref 101–111)
CO2: 25 mmol/L (ref 22–32)
CREATININE: 0.96 mg/dL (ref 0.61–1.24)
Calcium: 9.3 mg/dL (ref 8.9–10.3)
GFR calc non Af Amer: >60 mL/min (ref >60)
Glucose, Bld: 93 mg/dL (ref 65–99)
Potassium: 4.3 mmol/L (ref 3.5–5.1)
SODIUM: 137 mmol/L (ref 135–145)
Total Bilirubin: 0.2 mg/dL — ABNORMAL LOW (ref 0.3–1.2)
Total Protein: 7.6 g/dL (ref 6.5–8.1)

## 2016-12-17 LAB — SURGICAL PCR SCREEN
MRSA, PCR: NEGATIVE
STAPHYLOCOCCUS AUREUS: POSITIVE — AB

## 2016-12-17 NOTE — Telephone Encounter (Signed)
Received positive PCR screen for Staph Aureus. However, MRSA Screen negative. Per protocol, no further orders at this time.

## 2016-12-17 NOTE — Patient Instructions (Signed)
  Your procedure is scheduled on: Wednesday, December 24, 2016 Report to Same Day Surgery 2nd floor medical mall ([place] Entrance-take elevator on left to 2nd floor.  Check in with surgery information desk.) To find out your arrival time please call [phone] between 1PM - 3PM on Tuesday, December 23, 2016  Remember: Instructions that are not followed completely may result in serious medical risk, up to and including death, or upon the discretion of your surgeon and anesthesiologist your surgery may need to be rescheduled.    _x___ 1. Do not eat food or drink liquids after midnight. No gum chewing or hard candies.     __x__ 2. No Alcohol for 24 hours before or after surgery.   __x__3. No Smoking for 24 prior to surgery.   ____  4. Bring all medications with you on the day of surgery if instructed.    __x__ 5. Notify your doctor if there is any change in your medical condition     (cold, fever, infections).     Do not wear jewelry, make-up, hairpins, clips or nail polish.  Do not wear lotions, powders, or perfumes. You may wear deodorant.  Do not shave 48 hours prior to surgery. Men may shave face and neck.  Do not bring valuables to the hospital.    [facility] is not responsible for any belongings or valuables.               Contacts, dentures or bridgework may not be worn into surgery.  Leave your suitcase in the car. After surgery it may be brought to your room.  For patients admitted to the hospital, discharge time is determined by your treatment team.   Patients discharged the day of surgery will not be allowed to drive home.  You will need someone to drive you home and stay with you the night of your procedure.    Please read over the following fact sheets that you were given:   [facility] Preparing for Surgery and or MRSA Information   _x___ Take these medicines the morning of surgery with A SIP OF WATER:    1.  NONE  2.  3.  4.  5.  6.  __x__BOWEL PREP AS  DIRECTED  _x___ Use CHG Soap or sage wipes as directed on instruction sheet   ____ Use inhalers on the day of surgery and bring to hospital day of surgery  ____ Stop metformin 2 days prior to surgery    ____ Take 1/2 of usual insulin dose the night before surgery and none on the morning of           surgery.   ____ Stop Aspirin, Coumadin, Pllavix ,Eliquis, Effient, or Pradaxa  x__ Stop Anti-inflammatories such as Advil, Aleve, Ibuprofen, Motrin, Naproxen,          Naprosyn, Goodies powders or aspirin products. Ok to take Tylenol.   ____ Stop supplements until after surgery.    ____ Bring C-Pap to the hospital.

## 2016-12-22 ENCOUNTER — Telehealth: Payer: Self-pay | Admitting: Surgery

## 2016-12-22 NOTE — Telephone Encounter (Signed)
Patient used an inema and was concerned that one little stool came out immediately and it scared him. Should he use another one? Please call.

## 2016-12-22 NOTE — Telephone Encounter (Signed)
Spoke with patient at this time. I explained that he needs to complete 1 enema to empty rectum prior to surgery. He is having anxiety over this but will make sure that this is done prior to surgery.

## 2016-12-24 ENCOUNTER — Inpatient Hospital Stay
Admission: RE | Admit: 2016-12-24 | Discharge: 2016-12-28 | DRG: 330 | Disposition: A | Discharge status: Home / Self Care | Payer: BLUE CROSS/BLUE SHIELD | Source: Ambulatory Visit | Attending: Surgery | Admitting: Surgery

## 2016-12-24 ENCOUNTER — Inpatient Hospital Stay: Payer: BLUE CROSS/BLUE SHIELD | Admitting: Registered Nurse

## 2016-12-24 ENCOUNTER — Encounter
Admission: RE | Disposition: A | Discharge status: Home / Self Care | Payer: Self-pay | Source: Ambulatory Visit | Attending: Surgery

## 2016-12-24 ENCOUNTER — Inpatient Hospital Stay: Payer: BLUE CROSS/BLUE SHIELD

## 2016-12-24 ENCOUNTER — Encounter: Payer: Self-pay | Admitting: *Deleted

## 2016-12-24 DIAGNOSIS — Z87891 Personal history of nicotine dependence: Secondary | ICD-10-CM

## 2016-12-24 DIAGNOSIS — Z23 Encounter for immunization: Secondary | ICD-10-CM

## 2016-12-24 DIAGNOSIS — K432 Incisional hernia without obstruction or gangrene: Secondary | ICD-10-CM | POA: Diagnosis present

## 2016-12-24 DIAGNOSIS — Z885 Allergy status to narcotic agent status: Secondary | ICD-10-CM

## 2016-12-24 DIAGNOSIS — Z79899 Other long term (current) drug therapy: Secondary | ICD-10-CM | POA: Diagnosis not present

## 2016-12-24 DIAGNOSIS — K5720 Diverticulitis of large intestine with perforation and abscess without bleeding: Secondary | ICD-10-CM | POA: Diagnosis present

## 2016-12-24 DIAGNOSIS — K5792 Diverticulitis of intestine, part unspecified, without perforation or abscess without bleeding: Secondary | ICD-10-CM | POA: Diagnosis present

## 2016-12-24 DIAGNOSIS — Z433 Encounter for attention to colostomy: Principal | ICD-10-CM

## 2016-12-24 HISTORY — PX: COLOSTOMY TAKEDOWN: SHX5783

## 2016-12-24 HISTORY — PX: CYSTOSCOPY WITH STENT PLACEMENT: SHX5790

## 2016-12-24 HISTORY — PX: INCISIONAL HERNIA REPAIR: SHX193

## 2016-12-24 SURGERY — CLOSURE, COLOSTOMY
Anesthesia: General | Wound class: Contaminated

## 2016-12-24 MED ORDER — PROPOFOL 10 MG/ML IV BOLUS
INTRAVENOUS | Status: AC
  Filled 2016-12-24: qty 20

## 2016-12-24 MED ORDER — KETOROLAC TROMETHAMINE 30 MG/ML IJ SOLN
30.0000 mg | Freq: Four times a day (QID) | INTRAMUSCULAR | Status: DC
  Administered 2016-12-24: 30 mg via INTRAVENOUS
  Filled 2016-12-24 (×2): qty 1

## 2016-12-24 MED ORDER — FENTANYL CITRATE (PF) 100 MCG/2ML IJ SOLN
25.0000 ug | INTRAMUSCULAR | Status: AC | PRN
  Administered 2016-12-24 (×6): 25 ug via INTRAVENOUS

## 2016-12-24 MED ORDER — FENTANYL CITRATE (PF) 100 MCG/2ML IJ SOLN
INTRAMUSCULAR | Status: AC
  Administered 2016-12-24: 25 ug via INTRAVENOUS
  Filled 2016-12-24: qty 2

## 2016-12-24 MED ORDER — PANTOPRAZOLE SODIUM 40 MG PO TBEC
40.0000 mg | DELAYED_RELEASE_TABLET | Freq: Every day | ORAL | Status: DC
  Administered 2016-12-24 – 2016-12-28 (×5): 40 mg via ORAL
  Filled 2016-12-24 (×5): qty 1

## 2016-12-24 MED ORDER — CHLORHEXIDINE GLUCONATE CLOTH 2 % EX PADS: 6.0000 | MEDICATED_PAD | Freq: Once | CUTANEOUS | Status: DC

## 2016-12-24 MED ORDER — ONDANSETRON HCL 4 MG/2ML IJ SOLN
INTRAMUSCULAR | Status: DC | PRN
  Administered 2016-12-24: 4 mg via INTRAVENOUS

## 2016-12-24 MED ORDER — ACETAMINOPHEN 325 MG PO TABS
650.0000 mg | ORAL_TABLET | Freq: Four times a day (QID) | ORAL | Status: DC
  Administered 2016-12-24 – 2016-12-28 (×14): 650 mg via ORAL
  Filled 2016-12-24 (×14): qty 2

## 2016-12-24 MED ORDER — FENTANYL CITRATE (PF) 100 MCG/2ML IJ SOLN
INTRAMUSCULAR | Status: AC
Start: 2016-12-24 — End: 2016-12-24
  Filled 2016-12-24: qty 2

## 2016-12-24 MED ORDER — LACTATED RINGERS IV SOLN
INTRAVENOUS | Status: DC | PRN
  Administered 2016-12-24 (×3): via INTRAVENOUS

## 2016-12-24 MED ORDER — SUGAMMADEX SODIUM 500 MG/5ML IV SOLN
INTRAVENOUS | Status: DC | PRN
  Administered 2016-12-24: 200 mg via INTRAVENOUS

## 2016-12-24 MED ORDER — FLEET ENEMA 7-19 GM/118ML RE ENEM: 1.0000 | ENEMA | Freq: Once | RECTAL | Status: DC

## 2016-12-24 MED ORDER — BUPIVACAINE-EPINEPHRINE (PF) 0.25% -1:200000 IJ SOLN
INTRAMUSCULAR | Status: AC
  Filled 2016-12-24: qty 30

## 2016-12-24 MED ORDER — FENTANYL CITRATE (PF) 100 MCG/2ML IJ SOLN
INTRAMUSCULAR | Status: AC
  Filled 2016-12-24: qty 2

## 2016-12-24 MED ORDER — ENOXAPARIN SODIUM 40 MG/0.4ML ~~LOC~~ SOLN
40.0000 mg | SUBCUTANEOUS | Status: DC
Start: 2016-12-25 — End: 2016-12-28
  Administered 2016-12-25 – 2016-12-28 (×4): 40 mg via SUBCUTANEOUS
  Filled 2016-12-24 (×4): qty 0.4

## 2016-12-24 MED ORDER — ONDANSETRON HCL 4 MG/2ML IJ SOLN: 4.0000 mg | Freq: Once | INTRAMUSCULAR | Status: DC | PRN

## 2016-12-24 MED ORDER — ROCURONIUM BROMIDE 50 MG/5ML IV SOSY
PREFILLED_SYRINGE | INTRAVENOUS | Status: AC
  Filled 2016-12-24: qty 5

## 2016-12-24 MED ORDER — HYDROMORPHONE HCL 1 MG/ML IJ SOLN: 0.5000 mg | INTRAMUSCULAR | Status: DC | PRN

## 2016-12-24 MED ORDER — SUGAMMADEX SODIUM 200 MG/2ML IV SOLN
INTRAVENOUS | Status: AC
  Filled 2016-12-24: qty 2

## 2016-12-24 MED ORDER — LIDOCAINE HCL (CARDIAC) 20 MG/ML IV SOLN
INTRAVENOUS | Status: DC | PRN
  Administered 2016-12-24: 50 mg via INTRAVENOUS

## 2016-12-24 MED ORDER — BUPROPION HCL ER (SR) 150 MG PO TB12
150.0000 mg | ORAL_TABLET | Freq: Two times a day (BID) | ORAL | Status: DC
  Administered 2016-12-24 – 2016-12-26 (×4): 150 mg via ORAL
  Filled 2016-12-24 (×4): qty 1

## 2016-12-24 MED ORDER — ACETAMINOPHEN 10 MG/ML IV SOLN
INTRAVENOUS | Status: DC | PRN
  Administered 2016-12-24: 1000 mg via INTRAVENOUS

## 2016-12-24 MED ORDER — SODIUM CHLORIDE 0.9 % IV SOLN
INTRAVENOUS | Status: DC | PRN
  Administered 2016-12-24: 1 g via INTRAVENOUS

## 2016-12-24 MED ORDER — SUCCINYLCHOLINE CHLORIDE 200 MG/10ML IV SOSY
PREFILLED_SYRINGE | INTRAVENOUS | Status: AC
  Filled 2016-12-24: qty 10

## 2016-12-24 MED ORDER — MIDAZOLAM HCL 2 MG/2ML IJ SOLN
INTRAMUSCULAR | Status: AC
  Filled 2016-12-24: qty 2

## 2016-12-24 MED ORDER — ONDANSETRON HCL 4 MG/2ML IJ SOLN
4.0000 mg | Freq: Four times a day (QID) | INTRAMUSCULAR | Status: DC | PRN
  Administered 2016-12-25: 4 mg via INTRAVENOUS
  Filled 2016-12-24: qty 2

## 2016-12-24 MED ORDER — KETOROLAC TROMETHAMINE 30 MG/ML IJ SOLN
30.0000 mg | Freq: Four times a day (QID) | INTRAMUSCULAR | Status: DC
  Administered 2016-12-24 – 2016-12-28 (×14): 30 mg via INTRAVENOUS
  Filled 2016-12-24 (×14): qty 1

## 2016-12-24 MED ORDER — PROPOFOL 10 MG/ML IV BOLUS
INTRAVENOUS | Status: DC | PRN
  Administered 2016-12-24: 160 mg via INTRAVENOUS

## 2016-12-24 MED ORDER — DEXTROSE IN LACTATED RINGERS 5 % IV SOLN
INTRAVENOUS | Status: DC
  Administered 2016-12-24 – 2016-12-27 (×7): via INTRAVENOUS

## 2016-12-24 MED ORDER — OXYCODONE HCL 5 MG PO TABS
10.0000 mg | ORAL_TABLET | ORAL | Status: DC | PRN
  Administered 2016-12-24: 10 mg via ORAL
  Filled 2016-12-24: qty 2

## 2016-12-24 MED ORDER — SODIUM CHLORIDE 0.9 % IJ SOLN
INTRAMUSCULAR | Status: AC
  Filled 2016-12-24: qty 50

## 2016-12-24 MED ORDER — BUPIVACAINE-EPINEPHRINE 0.25% -1:200000 IJ SOLN
INTRAMUSCULAR | Status: DC | PRN
  Administered 2016-12-24: 30 mL

## 2016-12-24 MED ORDER — MIDAZOLAM HCL 2 MG/2ML IJ SOLN
INTRAMUSCULAR | Status: DC | PRN
  Administered 2016-12-24: 2 mg via INTRAVENOUS

## 2016-12-24 MED ORDER — SODIUM CHLORIDE 0.9 % IV SOLN
1.0000 g | INTRAVENOUS | Status: AC
  Filled 2016-12-24: qty 1

## 2016-12-24 MED ORDER — BUPIVACAINE HCL (PF) 0.25 % IJ SOLN
INTRAMUSCULAR | Status: AC
  Filled 2016-12-24: qty 30

## 2016-12-24 MED ORDER — SUCCINYLCHOLINE CHLORIDE 20 MG/ML IJ SOLN
INTRAMUSCULAR | Status: DC | PRN
  Administered 2016-12-24: 100 mg via INTRAVENOUS

## 2016-12-24 MED ORDER — ONDANSETRON HCL 4 MG/2ML IJ SOLN
INTRAMUSCULAR | Status: AC
  Filled 2016-12-24: qty 2

## 2016-12-24 MED ORDER — CYCLOBENZAPRINE HCL 10 MG PO TABS
10.0000 mg | ORAL_TABLET | Freq: Three times a day (TID) | ORAL | Status: DC
  Administered 2016-12-24 – 2016-12-28 (×12): 10 mg via ORAL
  Filled 2016-12-24 (×12): qty 1

## 2016-12-24 MED ORDER — FAMOTIDINE 20 MG PO TABS
ORAL_TABLET | ORAL | Status: AC
  Administered 2016-12-24: 20 mg via ORAL
  Filled 2016-12-24: qty 1

## 2016-12-24 MED ORDER — ACETAMINOPHEN 325 MG PO TABS
650.0000 mg | ORAL_TABLET | Freq: Four times a day (QID) | ORAL | Status: DC
  Administered 2016-12-24: 650 mg via ORAL
  Filled 2016-12-24 (×2): qty 2

## 2016-12-24 MED ORDER — INFLUENZA VAC SPLIT QUAD 0.5 ML IM SUSY
0.5000 mL | PREFILLED_SYRINGE | INTRAMUSCULAR | Status: AC
  Administered 2016-12-28: 0.5 mL via INTRAMUSCULAR
  Filled 2016-12-24: qty 0.5

## 2016-12-24 MED ORDER — FAMOTIDINE 20 MG PO TABS
20.0000 mg | ORAL_TABLET | Freq: Once | ORAL | Status: AC
  Administered 2016-12-24: 20 mg via ORAL

## 2016-12-24 MED ORDER — ALUM & MAG HYDROXIDE-SIMETH 200-200-20 MG/5ML PO SUSP
30.0000 mL | ORAL | Status: DC | PRN
  Administered 2016-12-24 – 2016-12-26 (×4): 30 mL via ORAL
  Filled 2016-12-24 (×4): qty 30

## 2016-12-24 MED ORDER — FENTANYL CITRATE (PF) 100 MCG/2ML IJ SOLN
INTRAMUSCULAR | Status: DC | PRN
  Administered 2016-12-24: 50 ug via INTRAVENOUS
  Administered 2016-12-24: 100 ug via INTRAVENOUS
  Administered 2016-12-24: 50 ug via INTRAVENOUS
  Administered 2016-12-24 (×2): 100 ug via INTRAVENOUS

## 2016-12-24 MED ORDER — BUPIVACAINE LIPOSOME 1.3 % IJ SUSP
INTRAMUSCULAR | Status: AC
  Filled 2016-12-24: qty 20

## 2016-12-24 MED ORDER — HYDRALAZINE HCL 20 MG/ML IJ SOLN: 10.0000 mg | INTRAMUSCULAR | Status: DC | PRN

## 2016-12-24 MED ORDER — ACETAMINOPHEN 10 MG/ML IV SOLN
INTRAVENOUS | Status: AC
  Filled 2016-12-24: qty 100

## 2016-12-24 MED ORDER — SODIUM CHLORIDE 0.9 % IV SOLN
INTRAVENOUS | Status: DC | PRN
  Administered 2016-12-24: 70 mL

## 2016-12-24 MED ORDER — ROCURONIUM BROMIDE 100 MG/10ML IV SOLN
INTRAVENOUS | Status: DC | PRN
  Administered 2016-12-24: 20 mg via INTRAVENOUS
  Administered 2016-12-24: 30 mg via INTRAVENOUS
  Administered 2016-12-24: 50 mg via INTRAVENOUS

## 2016-12-24 MED ORDER — LACTATED RINGERS IV SOLN
INTRAVENOUS | Status: DC
  Administered 2016-12-24: 07:00:00 via INTRAVENOUS

## 2016-12-24 MED ORDER — ONDANSETRON 4 MG PO TBDP: 4.0000 mg | ORAL_TABLET | Freq: Four times a day (QID) | ORAL | Status: DC | PRN

## 2016-12-24 SURGICAL SUPPLY — 71 items
BAG BILE T-TUBES STRL (MISCELLANEOUS) ×8 IMPLANT
BAG DRAIN CYSTO-URO LG1000N (MISCELLANEOUS) ×4 IMPLANT
CANISTER SUCT 1200ML W/VALVE (MISCELLANEOUS) ×4 IMPLANT
CATH ROBINSON RED A/P 16FR (CATHETERS) ×4 IMPLANT
CATH TRAY 16F METER LATEX (MISCELLANEOUS) ×4 IMPLANT
CATH URETL 5X70 OPEN END (CATHETERS) IMPLANT
CATH URETL OPEN END 6X70 (CATHETERS) ×8 IMPLANT
CHLORAPREP W/TINT 26ML (MISCELLANEOUS) ×4 IMPLANT
DRAIN CHANNEL JP 19F (MISCELLANEOUS) ×4 IMPLANT
DRAPE LAPAROTOMY 100X77 ABD (DRAPES) ×4 IMPLANT
DRAPE LEGGINS SURG 28X43 STRL (DRAPES) ×8 IMPLANT
DRAPE UNDER BUTTOCK W/FLU (DRAPES) ×8 IMPLANT
ELECT BLADE 6.5 EXT (BLADE) ×4 IMPLANT
ELECT CAUTERY BLADE 6.4 (BLADE) ×4 IMPLANT
ELECT REM PT RETURN 9FT ADLT (ELECTROSURGICAL) ×4
ELECTRODE REM PT RTRN 9FT ADLT (ELECTROSURGICAL) ×2 IMPLANT
GAUZE SPONGE 4X4 12PLY STRL (GAUZE/BANDAGES/DRESSINGS) IMPLANT
GLOVE BIO SURGEON STRL SZ7.5 (GLOVE) ×20 IMPLANT
GLOVE BIO SURGEON STRL SZ8 (GLOVE) ×4 IMPLANT
GLOVE PI ORTHOPRO 6.5 (GLOVE) ×6
GLOVE PI ORTHOPRO STRL 6.5 (GLOVE) ×6 IMPLANT
GOWN STRL REUS W/ TWL LRG LVL3 (GOWN DISPOSABLE) ×12 IMPLANT
GOWN STRL REUS W/ TWL LRG LVL4 (GOWN DISPOSABLE) ×2 IMPLANT
GOWN STRL REUS W/ TWL XL LVL3 (GOWN DISPOSABLE) ×2 IMPLANT
GOWN STRL REUS W/TWL LRG LVL3 (GOWN DISPOSABLE) ×12
GOWN STRL REUS W/TWL LRG LVL4 (GOWN DISPOSABLE) ×2
GOWN STRL REUS W/TWL XL LVL3 (GOWN DISPOSABLE) ×2
GUIDEWIRE STR ZIPWIRE 035X150 (MISCELLANEOUS) ×4 IMPLANT
JACKSON PRATT 10 (INSTRUMENTS) IMPLANT
LABEL OR SOLS (LABEL) ×4 IMPLANT
LIGASURE IMPACT 36 18CM CVD LR (INSTRUMENTS) IMPLANT
NEEDLE HYPO 22GX1.5 SAFETY (NEEDLE) ×8 IMPLANT
NS IRRIG 1000ML POUR BTL (IV SOLUTION) ×4 IMPLANT
NS IRRIG 500ML POUR BTL (IV SOLUTION) ×8 IMPLANT
PACK BASIN MAJOR ARMC (MISCELLANEOUS) ×4 IMPLANT
PACK COLON CLEAN CLOSURE (MISCELLANEOUS) IMPLANT
PACK CYSTO AR (MISCELLANEOUS) ×4 IMPLANT
PREP PVP WINGED SPONGE (MISCELLANEOUS) IMPLANT
RELOAD PROXIMATE 75MM BLUE (ENDOMECHANICALS) ×4 IMPLANT
RETAINER VISCERA MED (MISCELLANEOUS) IMPLANT
SET CYSTO W/LG BORE CLAMP LF (SET/KITS/TRAYS/PACK) ×4 IMPLANT
SOL .9 NS 3000ML IRR  AL (IV SOLUTION) ×2
SOL .9 NS 3000ML IRR UROMATIC (IV SOLUTION) ×2 IMPLANT
SOL PREP PVP 2OZ (MISCELLANEOUS) ×4
SOLUTION PREP PVP 2OZ (MISCELLANEOUS) ×2 IMPLANT
SPONGE LAP 18X18 5 PK (GAUZE/BANDAGES/DRESSINGS) ×12 IMPLANT
STAPLER ENDO ILS CVD 18 33 (STAPLE) ×4 IMPLANT
STAPLER PROXIMATE 75MM BLUE (STAPLE) ×4 IMPLANT
STAPLER SKIN PROX 35W (STAPLE) ×4 IMPLANT
STENT URET 6FRX24 CONTOUR (STENTS) ×4 IMPLANT
STENT URET 6FRX26 CONTOUR (STENTS) ×4 IMPLANT
SUCT RESERVOIR 100CC (MISCELLANEOUS) ×4 IMPLANT
SURGILUBE 2OZ TUBE FLIPTOP (MISCELLANEOUS) ×4 IMPLANT
SUT ETHIBOND 0 MO6 C/R (SUTURE) IMPLANT
SUT ETHILON 3-0 FS-10 30 BLK (SUTURE) ×4
SUT PDS AB 0 CT1 27 (SUTURE) ×16 IMPLANT
SUT PDS AB 2-0 CT1 27 (SUTURE) ×4 IMPLANT
SUT PDS AB 2-0 CT2 27 (SUTURE) ×8 IMPLANT
SUT PROLENE 0 CT 1 30 (SUTURE) ×4 IMPLANT
SUT SILK 3-0 (SUTURE) ×4 IMPLANT
SUT VIC AB 0 CT2 27 (SUTURE) ×28 IMPLANT
SUT VIC AB 2-0 CT1 27 (SUTURE) ×2
SUT VIC AB 2-0 CT1 TAPERPNT 27 (SUTURE) ×2 IMPLANT
SUT VIC AB 3-0 SH 27 (SUTURE)
SUT VIC AB 3-0 SH 27X BRD (SUTURE) IMPLANT
SUT VICRYL+ 3-0 144IN (SUTURE) ×4 IMPLANT
SUTURE EHLN 3-0 FS-10 30 BLK (SUTURE) ×2 IMPLANT
SYR 30ML LL (SYRINGE) ×16 IMPLANT
SYR BULB IRRIG 60ML STRL (SYRINGE) ×4 IMPLANT
SYRINGE IRR TOOMEY STRL 70CC (SYRINGE) ×4 IMPLANT
WATER STERILE IRR 1000ML POUR (IV SOLUTION) ×4 IMPLANT

## 2016-12-24 NOTE — Brief Op Note (Signed)
12/24/2016  12:51 PM  PATIENT:  Clayton Butler  56 y.o. 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 Dahlia Byes   ANESTHESIA:   general  EBL:  Total I/O In: 2000 [I.V.:2000] Out: 400 [Urine:300; Blood:100]  BLOOD ADMINISTERED:none  DRAINS: (1) Jackson-Pratt drain(s) with closed bulb suction in the pelvis   LOCAL MEDICATIONS USED:  OTHER Exparil  SPECIMEN:  Source of Specimen:  descending colon ostomy  DISPOSITION OF SPECIMEN:  PATHOLOGY  COUNTS:  YES  TOURNIQUET:  * No tourniquets in log *  DICTATION: .Dragon Dictation  PLAN OF CARE: Admit to inpatient   PATIENT DISPOSITION:  PACU - hemodynamically stable.   Delay start of Pharmacological VTE agent (>24hrs) due to surgical blood loss or risk of bleeding: not applicable

## 2016-12-24 NOTE — H&P (Signed)
Clayton Butler is an 56 y.o. male.   Chief Complaint: Colostomy HPI: 56 year old male who had a perforated diverticulitis and a Hartman's procedure performed back in June as well as a dehiscence and then another abdominal wall repair.  The patient is doing well at this time. He has been back to work and been golfing and exercising.  He has quit smoking and is doing well.    Past Medical History:  Diagnosis Date  . Cervical radiculitis 10/20/2014  . Diverticulitis of colon with perforation 05/20/2016  . Wears dentures    full upper    Past Surgical History:  Procedure Laterality Date  . COLECTOMY WITH COLOSTOMY CREATION/HARTMANN PROCEDURE N/A 05/22/2016   Procedure: COLECTOMY WITH COLOSTOMY CREATION/HARTMANN PROCEDURE;  Surgeon: Jules Husbands, MD;  Location: [facility];  Service: General;  Laterality: N/A;  . COLONOSCOPY WITH PROPOFOL N/A 10/09/2016   Procedure: COLONOSCOPY WITH PROPOFOL;  Surgeon: Lucilla Lame, MD;  Location: [place];  Service: Endoscopy;  Laterality: N/A;  . HAND SURGERY Left 1987  . LAPAROTOMY N/A 05/22/2016   Procedure: EXPLORATORY LAPAROTOMY;  Surgeon: Jules Husbands, MD;  Location: [facility];  Service: General;  Laterality: N/A;  . LAPAROTOMY N/A 05/27/2016   Procedure: EXPLORATORY LAPAROTOMY;  Surgeon: Hubbard Robinson, MD;  Location: [facility];  Service: General;  Laterality: N/A;  . POLYPECTOMY N/A 10/09/2016   Procedure: POLYPECTOMY, 8 colon polyps;  Surgeon: Lucilla Lame, MD;  Location: [place];  Service: Endoscopy;  Laterality: N/A;    Family History  Problem Relation Age of Onset  . Stroke Mother   . Stroke Father    Social History:  reports that he has been smoking Cigarettes.  He has smoked for the past 30.00 years. He has never used smokeless tobacco. He reports that he drinks about 6.0 oz of alcohol per week . He reports that he does not use drugs.  Allergies:  Allergies  Allergen Reactions  . Morphine And Related Other (See  Comments)    Syncope    Medications Prior to Admission  Medication Sig Dispense Refill  . buPROPion (WELLBUTRIN SR) 150 MG 12 hr tablet Take 1 tablet (150 mg total) by mouth 2 (two) times daily. Take 1 tablet by mouth for first 3 days, then increase to twice daily. (Patient taking differently: Take 150 mg by mouth daily. ) 60 tablet 2  . polyethylene glycol (MIRALAX / GLYCOLAX) packet Take 17 g by mouth every other day.      No results found for this or any previous visit (from the past 48 hour(s)). No results found.  Review of Systems  Constitutional: Negative for chills and fever.  HENT: Negative for congestion.   Respiratory: Negative for cough and wheezing.   Cardiovascular: Negative for chest pain and claudication.  Genitourinary: Negative for dysuria and hematuria.  Musculoskeletal: Negative for back pain and falls.  Skin: Negative for itching and rash.  Neurological: Negative for dizziness, seizures and weakness.  Psychiatric/Behavioral: The patient does not have insomnia.   All other systems reviewed and are negative.   Blood pressure 140/86, pulse 92, temperature 97.8 F (36.6 C), temperature source Tympanic, resp. rate 16, height 6\' 2"  (1.88 m), weight 209 lb (94.8 kg), SpO2 96 %. Physical Exam  Vitals reviewed. Constitutional: He is oriented to person, place, and time. He appears well-developed and well-nourished. No distress.  HENT:  Head: Normocephalic and atraumatic.  Right Ear: External ear normal.  Left Ear: External ear normal.  Nose: Nose normal.  Mouth/Throat: Oropharynx is clear and moist. No oropharyngeal exudate.  Eyes: Conjunctivae and EOM are normal. Pupils are equal, round, and reactive to light. No scleral icterus.  Neck: Normal range of motion. Neck supple.  Cardiovascular: Normal rate, regular rhythm, normal heart sounds and intact distal pulses.  Exam reveals no gallop and no friction rub.   No murmur heard. Respiratory: Effort normal and breath  sounds normal. No respiratory distress. He has no wheezes. He has no rales.  GI: Soft. Bowel sounds are normal. He exhibits no distension. There is no tenderness.  Colostomy pink and patent with small hernia in inferior portion   Musculoskeletal: Normal range of motion. He exhibits no edema, tenderness or deformity.  Neurological: He is alert and oriented to person, place, and time. No cranial nerve deficit.  Skin: Skin is warm and dry. No rash noted. No erythema.  Psychiatric: He has a normal mood and affect. His behavior is normal. Judgment and thought content normal.     Assessment/Plan I discussed the risks, benefits, alternatives, and expected outcomes of the colostomy reversal with the patient. I discussed with him that due to the extensive scarring in the fact that he had a dehiscence last time that we would need to do an open repair and could not be done laparoscopically. I discussed with him the risk involved including bleeding, infection of the wound as well as an abscess,  scar tissue to the bowel, injury to the small or large bowel from scar tissue, needing to remove a further portion of the colon to remove all of the diverticular diseased portion, injury to the ureters requiring repair and stent placement, need for drain placement of the surgery, need for another ostomy placement, need for a possible potential ileostomy placement, possible need for further procedures, possible need for a blood transfusion, additionally with his hernia discussed the risk of potentially needing to perform an abdominal component separation were re-takedown different layers of the abdominal wall to reconstruct as well as potential for putting a biologic mesh in to give a strong closure of the abdomen. I also discussed anesthetic risks to include heart attack, stroke, blood clots in the legs and lungs and potentially death. I also discussed today approximately 5-7 day stay in the hospital if all goes well and need  to stay out of work for approximately 3 weeks afterwards. The patient was given opportunity to ask questions and have them answered and he is in agreement with the plan as above.  I will plan for an open colostomy reversal with potential ventral hernia repair and possible ostomy placement as well as urologic ureteral stent placements prior to the operation likely with Dr. Dahlia Byes to assist in the surgery as well on January 17th.   Hubbard Robinson, MD 12/24/2016, 7:12 AM

## 2016-12-24 NOTE — Op Note (Signed)
Preoperative diagnosis: sigmoid diverticulitis Postoperative diagnosis: same  Procedure: cystoscopy, bilateral retrograde pyelogram, ureteral stent placement, Foley catheter placement  Surgeon: Elam Ellis  Anesthesia: Gen.  Indication for procedure: 56 year old white male status post sigmoid colectomy and Hartman's procedure for perforated diverticulitis. He presents today for colostomy reversal and Dr. Azalee Course requested ureteral stent placement.  Findings: On cystoscopy the urethra and prostatic urethra were unremarkable. The prostatic urethra and bladder neck was rather rigid. The bladder appeared normal. There was no evidence of tumor or fistula on the mucosa. There was no stone or foreign body in the bladder. The ureteral orifices in their normal orthotopic position with clear efflux.  Right retrograde pyelogram-this outlined a single ureter single collecting system unit without filling defect, stricture dilation. Because of the bed the upper pole calyx was not visualized.  Left retrograde pyelogram-this outlined a single ureter single collecting system unit without filling defect, stricture or dilation.  Description of procedure: After consent was obtained patient brought to the operating room. After adequate anesthesia he was placed in lithotomy position and prepped and draped in the usual sterile fashion. A timeout was performed to confirm the patient and procedure. The cystoscope was passed per urethra and the bladder inspected. The right ureteral orifice was cannulated with a 6 [place] open-ended catheter and retrograde injection of contrast was performed. I then passed a Glidewire into the collecting system and over the Glidewire advanced the 6 [place] open-ended catheter into the proximal ureter. The wire was removed leaving the catheter in place. Left ureteral orifice was cannulated with a second 6 [place] open-ended catheter. Retrograde injection of contrast was performed. The Glidewire was  advanced into the collecting system and over the wire the 6 [place] open-ended catheter was advanced. The 6 [place] open-ended catheter was left in the proximal ureter and the wire removed. The scope was broken and removed leaving the catheters in placed. I then placed a 59 French Foley adjacent to the catheters and seated the balloon at the bladder neck. There was clear drainage. The ureteral stents were secured to the Foley with silk ties. They were placed and the adapter to drain into the Foley bag. The patient was then turned over to Dr. Azalee Course.   I cut the LEFT ureteral stent at angle and left the suture long on the Foley. The right ureteral stent remains a right angle at the distal end.   Complications: None Blood loss: None Specimens: None  Drains: Bilateral 6 x 26 cm externalized ureteral stents, 16 French Foley catheter

## 2016-12-24 NOTE — OR Nursing (Signed)
Dr. Junious Silk used 78ml of Conray 43 during his procedure.

## 2016-12-24 NOTE — Progress Notes (Signed)
  Pt seen and examined this AM with Dr. Azalee Course. We discussed the patient. I reviewed this chart, labs and CT. He has no urologic history. He had an episode of acute nephritis about 30 yrs ago. Discussed with patient and family the nature, r/b/a to cystoscopy, ureteral stent placement and foley catheter placement. All questions answered. He elects to proceed.

## 2016-12-24 NOTE — Transfer of Care (Signed)
Immediate Anesthesia Transfer of Care Note  Patient: Clayton Butler  Procedure(s) Performed: Procedure(s): COLOSTOMY TAKEDOWN and possible incisional hernia repair (N/A) HERNIA REPAIR INCISIONAL (N/A) CYSTOSCOPY WITH STENT PLACEMENT (Bilateral)  Patient Location: PACU  Anesthesia Type:General  Level of Consciousness: awake and alert   Airway & Oxygen Therapy: Patient Spontanous Breathing  Post-op Assessment: Report given to RN  Post vital signs: Reviewed and stable  Last Vitals:  Vitals:   12/24/16 0612 12/24/16 1225  BP: 140/86 128/87  Pulse: 92 (!) 104  Resp: 16 12  Temp: 36.6 C 37 C    Last Pain:  Vitals:   12/24/16 0612  TempSrc: Tympanic  PainSc: 0-No pain         Complications: No apparent anesthesia complications

## 2016-12-24 NOTE — Anesthesia Preprocedure Evaluation (Signed)
Anesthesia Evaluation  Patient identified by MRN, date of birth, ID band Patient awake    Reviewed: Allergy & Precautions, H&P , NPO status , Patient's Chart, lab work & pertinent test results, reviewed documented beta blocker date and time   Airway Mallampati: II  TM Distance: >3 FB Neck ROM: full    Dental  (+) Teeth Intact   Pulmonary neg pulmonary ROS, Current Smoker,    Pulmonary exam normal        Cardiovascular negative cardio ROS Normal cardiovascular exam Rhythm:regular Rate:Normal     Neuro/Psych  Neuromuscular disease negative neurological ROS  negative psych ROS   GI/Hepatic negative GI ROS, Neg liver ROS,   Endo/Other  negative endocrine ROS  Renal/GU negative Renal ROS  negative genitourinary   Musculoskeletal   Abdominal   Peds  Hematology negative hematology ROS (+)   Anesthesia Other Findings Past Medical History: 10/20/2014: Cervical radiculitis 05/20/2016: Diverticulitis of colon with perforation No date: Wears dentures     Comment: full upper Past Surgical History: 05/22/2016: COLECTOMY WITH COLOSTOMY CREATION/HARTMANN PRO* N/A     Comment: Procedure: COLECTOMY WITH COLOSTOMY               CREATION/HARTMANN PROCEDURE;  Surgeon: Jules Husbands, MD;  Location: [facility];  Service:               General;  Laterality: N/A; 10/09/2016: COLONOSCOPY WITH PROPOFOL N/A     Comment: Procedure: COLONOSCOPY WITH PROPOFOL;                Surgeon: Lucilla Lame, MD;  Location: [place];  Service: Endoscopy;  Laterality:              N/A; 1987: HAND SURGERY Left 05/22/2016: LAPAROTOMY N/A     Comment: Procedure: EXPLORATORY LAPAROTOMY;  Surgeon:               Jules Husbands, MD;  Location: [facility];                Service: General;  Laterality: N/A; 05/27/2016: LAPAROTOMY N/A     Comment: Procedure: EXPLORATORY LAPAROTOMY;  Surgeon:               Hubbard Robinson, MD;   Location: [facility];                Service: General;  Laterality: N/A; 10/09/2016: POLYPECTOMY N/A     Comment: Procedure: POLYPECTOMY, 8 colon polyps;                Surgeon: Lucilla Lame, MD;  Location: [place];  Service: Endoscopy;  Laterality:              N/A; BMI    Body Mass Index:  26.83 kg/m     Reproductive/Obstetrics negative OB ROS                             Anesthesia Physical Anesthesia Plan  ASA: II  Anesthesia Plan: General ETT   Post-op Pain Management:    Induction:   Airway Management Planned:   Additional Equipment:   Intra-op Plan:   Post-operative Plan:   Informed Consent: I have reviewed  the patients History and Physical, chart, labs and discussed the procedure including the risks, benefits and alternatives for the proposed anesthesia with the patient or authorized representative who has indicated his/her understanding and acceptance.   Dental Advisory Given  Plan Discussed with: CRNA  Anesthesia Plan Comments:         Anesthesia Quick Evaluation

## 2016-12-24 NOTE — Anesthesia Post-op Follow-up Note (Cosign Needed)
Anesthesia QCDR form completed.        

## 2016-12-24 NOTE — Anesthesia Procedure Notes (Signed)
Procedure Name: Intubation Date/Time: 12/24/2016 7:45 AM Performed by: [email or web address], Orma Cheetham Pre-anesthesia Checklist: Timeout performed, Patient being monitored, Suction available, Emergency Drugs available and Patient identified Patient Re-evaluated:Patient Re-evaluated prior to inductionOxygen Delivery Method: Circle system utilized Preoxygenation: Pre-oxygenation with 100% oxygen Intubation Type: IV induction Laryngoscope Size: Mac and 4 Grade View: Grade II Tube type: Oral Tube size: 7.5 mm Number of attempts: 1 Placement Confirmation: ETT inserted through vocal cords under direct vision,  positive ETCO2,  breath sounds checked- equal and bilateral and CO2 detector Secured at: 23 cm Tube secured with: Tape

## 2016-12-25 DIAGNOSIS — K432 Incisional hernia without obstruction or gangrene: Secondary | ICD-10-CM

## 2016-12-25 DIAGNOSIS — K5720 Diverticulitis of large intestine with perforation and abscess without bleeding: Secondary | ICD-10-CM

## 2016-12-25 LAB — SURGICAL PATHOLOGY

## 2016-12-25 LAB — BASIC METABOLIC PANEL
ANION GAP: 4 — AB (ref 5–15)
BUN: 12 mg/dL (ref 6–20)
CHLORIDE: 105 mmol/L (ref 101–111)
CO2: 27 mmol/L (ref 22–32)
Calcium: 8 mg/dL — ABNORMAL LOW (ref 8.9–10.3)
Creatinine, Ser: 1.21 mg/dL (ref 0.61–1.24)
GFR calc non Af Amer: >60 mL/min (ref >60)
Glucose, Bld: 149 mg/dL — ABNORMAL HIGH (ref 65–99)
Potassium: 3.8 mmol/L (ref 3.5–5.1)
Sodium: 136 mmol/L (ref 135–145)

## 2016-12-25 LAB — CBC
HCT: 38.7 % — ABNORMAL LOW (ref 40.0–52.0)
HEMOGLOBIN: 14.1 g/dL (ref 13.0–18.0)
MCH: 35.6 pg — AB (ref 26.0–34.0)
MCHC: 36.4 g/dL — ABNORMAL HIGH (ref 32.0–36.0)
MCV: 97.8 fL (ref 80.0–100.0)
Platelets: 176 10*3/uL (ref 150–440)
RBC: 3.95 MIL/uL — AB (ref 4.40–5.90)
RDW: 14.1 % (ref 11.5–14.5)
WBC: 11 10*3/uL — AB (ref 3.8–10.6)

## 2016-12-25 MED ORDER — PHENAZOPYRIDINE HCL 100 MG PO TABS
100.0000 mg | ORAL_TABLET | Freq: Three times a day (TID) | ORAL | Status: DC | PRN
  Administered 2016-12-25 – 2016-12-27 (×3): 100 mg via ORAL
  Filled 2016-12-25 (×3): qty 1

## 2016-12-25 NOTE — Op Note (Addendum)
Open partial colectomy, reversal of colostomy, repair of ventral hernia Procedure Note  Indications: A 56 year old male who had perforated diverticulitis 6 months prior and required a emergent Hartman's procedure and then subsequently had evisceration required reoperation a few days afterwards. The patient has since been doing well and is now a candidate for colostomy reversal.  Pre-operative Diagnosis: 1. Perforated diverticulitis 2. 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 Caroleen Hamman,  Dr. Junious Silk of urology  Anesthesia: General endotracheal anesthesia  ASA Class: 2  Procedure Details  The patient was seen in the Holding Room. The risks, benefits, complications, treatment options, and expected outcomes were discussed with the patient. The possibilities of reaction to medication, pulmonary aspiration, perforation of viscus, bleeding, recurrent infection, finding a normal colon, the need for additional procedures, failure to diagnose a condition, and creating a complication requiring transfusion or operation were discussed with the patient. The patient concurred with the proposed plan, giving informed consent.   The patient was taken to the operating room, identified as Clayton Butler and the procedure verified as colostomy reversal and ventral hernia repair A Time Out was held and the above information confirmed.  The patient was brought to the operating room and placed supine. After induction of a general anesthetic the patient was placed in lithotomy and Dr. Junious Silk of urology performed his portion of the procedure which is dictated in a separate note where he performed a cystoscopy with bilateral ureteral stent placement.  After his portion was completed he was placed then in low lithotomy position and the abdomen was then prepped and draped in the usual sterile fashion.  A 15 blade scalpel was used to incise  around his scar on the abdomen making elliptical incision around this and the 10 blade scalpel was used to resect this from the subcutaneous tissues underneath. A Bovie electrocautery was then used to dissect down to the anterior abdominal fascia this was grasped with Kocher clamps and elevated up. A 15 blade scalpel was used to incise the anterior abdominal fascia and blunt dissection is used to enter into the abdominal cavity. The abdominal fascia was then elevated up using clamps and careful dissection into the abdomen was obtained from superior to inferior. This took approximately 60 minutes to complete. There was small bowel that was adhesed to the anterior abdominal wall and was taken down with a combination of blunt and sharp dissection.  Once the bowel was all taken down the abdominal cavity was examined and the small bowel was run from the ligament of Treitz down to the cecum without any injuries to the small bowel. The colon was then inspected from the cecum to the colostomy at the descending colon is all intact without any additional diverticular disease in this portion of the colon. The pelvis was also inspected and dissection of adhesions was performed bluntly and the distal rectum was clearly identified and able to be free from the surrounding pelvic wall. The ureters could be palpated with the stents in place however were a long way from the rectal stump.  Bovie electric cautery was used to dissect around the colostomy on the skin and blunt and sharp dissection was performed to place the colostomy back into the abdomen.  A 75 GIA stapler was then used to staple across the distal and and the descending colon and was then sent to pathology. The descending colon and splenic flexure were then fully mobilized on the white  line of [place]t in a bloodless plane using a combination of blunt and sharp dissection all the way to the mid transverse colon giving a good length of colon that went all the way down  into the pelvis without any tension.    At this point the staple line on the descending colon was grasped with Allis clamps and resected with the Bovie electrocautery sizers were used in a 33 GIA stapler was selected. The anvil was then placed in the distal and and a pursestring suture of a 2-0 Prolene was tied around to secure. The EEA stapler was then brought from below and was a little bit tight toward the very distal end of the rectum on the anterior portion and so was brought out anteriorly at this 2 cm from the distal end.  The anvil was then placed in the EEA stapler and this was closed down and then fired. 2 good doughnuts and were removed from this and sent to pathology.  The anastomosis was then examined by clamping the colon placing water in the abdomen and insufflating with air.  The colon did become distended with air but there was no leakage of any air bubbles. The abdomen was then irrigated out with saline and further inspected again no damage to bowel and the anastomosis appeared good and intact without any bleeding. A 19 [place] JP drain was then placed into the pelvis coming out of the left lower quadrant.  The omentum was then placed down over the top of the anastomosis.  Attention was then turned to the ventral hernia that did have significant scarring along the anterior fascia. Bilateral muscular fascial flaps were created in approximately 4 cm back from the entire length of the incision on both sides. This gave ample area for closure of the fascia without any tension. A 0 PDS was then used to close the posterior fascia of the colostomy incision in a running stitch. The anterior fascia there was additionally closed in a similar manner using a 0 PDS. This wound was irrigated out and then closed with staples. 0 PDS was used in running continuous suture with interrupted 0 Vicryl figure-of-eight sutures every 4-5 stitches to provide additional decrease of tension.  The anterior abdominal fascia  closed well.  The subcutaneous tissue is then irrigated out. The JP drain was secured with a 3-0 nylon in a Roman sandle fashion.  The midline wound was then closed loosely with staples. Sterile dressings were then applied  Instrument, sponge, and needle counts were correct prior to abdominal closure and at the conclusion of the case.   Findings:   End to side colorectal anastomosis with 33 mm EEA stapler airtight on inspection, JP drain placed in the pelvis  Estimated Blood Loss: less than 100 mL         Drains: JP drain in plevis         Total IV Fluids: 2022mL         Specimens: descending colon and EEA staple rings           Complications: None; patient tolerated the procedure well.         Disposition: PACU - hemodynamically stable.         Condition: stable

## 2016-12-25 NOTE — Progress Notes (Signed)
12/25/2016  Subjective: Patient is 1 Day Post-Op s/p colostomy takedown and repair of ventral hernia.  No acute events overnight.  Pain controlled with medications.    Vital signs: Temp:  [97.6 F (36.4 C)-98.5 F (36.9 C)] 97.7 F (36.5 C) (01/18 0740) Pulse Rate:  [88-100] 94 (01/18 0740) Resp:  [9-18] 17 (01/18 0740) BP: (115-134)/(63-86) 119/63 (01/18 0740) SpO2:  [96 %-100 %] 96 % (01/18 0740)   Intake/Output: 01/17 0701 - 01/18 0700 In: 4449 [P.O.:240; I.V.:4209] Out: 1310 [Urine:900; Drains:310; Blood:100]    Physical Exam: Constitutional:  No acute distress Abdomen:  Soft, nondistended, appropriately tender to palpation. Incisions with dressings clean and dry.  JP drain with serosanguinous fluid. GU: foley catheter and stents in place, with improving bloody urine, more clear today but still with blood tinge  Labs:   Recent Labs  12/25/16 0401  WBC 11.0*  HGB 14.1  HCT 38.7*  PLT 176    Recent Labs  12/25/16 0401  NA 136  K 3.8  CL 105  CO2 27  GLUCOSE 149*  BUN 12  CREATININE 1.21  CALCIUM 8.0*   No results for input(s): LABPROT, INR in the last 72 hours.  Imaging: No results found.  Assessment/Plan: 56 yo male s/p colostomy reversal and ventral hernia repair.  --Continue clear liquid diet.  Decrease IVF. --OOB, ambulate --continue foley catheter today for strict In/Out measurement and because of bloody urine.  If urine clears up later today, will discontinue foley and stents. --resume home meds.   Melvyn Neth, [place]

## 2016-12-25 NOTE — Progress Notes (Signed)
Per MD, if catheter is flushed and no clots come out, okay to remove foley catheter. Catheter was flushed with saline and no clots came out. Foley and stent was removed. Pt tolerated well.

## 2016-12-26 LAB — BASIC METABOLIC PANEL
ANION GAP: 5 (ref 5–15)
BUN: 15 mg/dL (ref 6–20)
CALCIUM: 8.2 mg/dL — AB (ref 8.9–10.3)
CO2: 29 mmol/L (ref 22–32)
Chloride: 102 mmol/L (ref 101–111)
Creatinine, Ser: 1.3 mg/dL — ABNORMAL HIGH (ref 0.61–1.24)
GFR calc Af Amer: >60 mL/min (ref >60)
GFR calc non Af Amer: >60 mL/min (ref >60)
GLUCOSE: 113 mg/dL — AB (ref 65–99)
Potassium: 3.4 mmol/L — ABNORMAL LOW (ref 3.5–5.1)
Sodium: 136 mmol/L (ref 135–145)

## 2016-12-26 LAB — CBC WITH DIFFERENTIAL/PLATELET
BASOS ABS: 0 10*3/uL (ref 0–0.1)
Basophils Relative: 0 %
Eosinophils Absolute: 0.1 10*3/uL (ref 0–0.7)
Eosinophils Relative: 1 %
HEMATOCRIT: 34.6 % — AB (ref 40.0–52.0)
Hemoglobin: 12.4 g/dL — ABNORMAL LOW (ref 13.0–18.0)
LYMPHS PCT: 19 %
Lymphs Abs: 2 10*3/uL (ref 1.0–3.6)
MCH: 34.8 pg — ABNORMAL HIGH (ref 26.0–34.0)
MCHC: 35.9 g/dL (ref 32.0–36.0)
MCV: 97 fL (ref 80.0–100.0)
MONO ABS: 0.8 10*3/uL (ref 0.2–1.0)
MONOS PCT: 8 %
NEUTROS ABS: 7.3 10*3/uL — AB (ref 1.4–6.5)
Neutrophils Relative %: 72 %
Platelets: 178 10*3/uL (ref 150–440)
RBC: 3.56 MIL/uL — ABNORMAL LOW (ref 4.40–5.90)
RDW: 13.8 % (ref 11.5–14.5)
WBC: 10.1 10*3/uL (ref 3.8–10.6)

## 2016-12-26 LAB — MAGNESIUM: Magnesium: 2 mg/dL (ref 1.7–2.4)

## 2016-12-26 MED ORDER — BUPROPION HCL ER (SR) 150 MG PO TB12
150.0000 mg | ORAL_TABLET | Freq: Every day | ORAL | Status: DC
  Administered 2016-12-27 – 2016-12-28 (×2): 150 mg via ORAL
  Filled 2016-12-26 (×2): qty 1

## 2016-12-26 NOTE — Progress Notes (Signed)
56 yr old male POD#2 from Colostomy takedown.  Patient doing well today.  He states pain well controlled.  He is still having acid reflux and did have some emesis after a bad episode.  He has tolerated clears since then. He is voiding well.   Vitals:   12/26/16 0451 12/26/16 0754  BP: 119/69 109/66  Pulse: (!) 109 100  Resp: (!) 22 16  Temp: 98.5 F (36.9 C) 98.4 F (36.9 C)   I/O last 3 completed shifts: In: 3089.5 [I.V.:3089.5] Out: 1995 [Urine:1475; Drains:520] Total I/O In: 0  Out: 475 [Urine:395; Drains:80]   PE:  Gen: NAD Abd: soft, moderately distended, midline incision c/d/i, loosely closed with staples, serosanginous drainage on wound and JP drain  CBC Latest Ref Rng & Units 12/26/2016 12/25/2016 12/17/2016  WBC 3.8 - 10.6 K/uL 10.1 11.0(H) 8.5  Hemoglobin 13.0 - 18.0 g/dL 12.4(L) 14.1 15.7  Hematocrit 40.0 - 52.0 % 34.6(L) 38.7(L) 45.4  Platelets 150 - 440 K/uL 178 176 214   CMP Latest Ref Rng & Units 12/26/2016 12/25/2016 12/17/2016  Glucose 65 - 99 mg/dL 113(H) 149(H) 93  BUN 6 - 20 mg/dL 15 12 11   Creatinine 0.61 - 1.24 mg/dL 1.30(H) 1.21 0.96  Sodium 135 - 145 mmol/L 136 136 137  Potassium 3.5 - 5.1 mmol/L 3.4(L) 3.8 4.3  Chloride 101 - 111 mmol/L 102 105 104  CO2 22 - 32 mmol/L 29 27 25   Calcium 8.9 - 10.3 mg/dL 8.2(L) 8.0(L) 9.3  Total Protein 6.5 - 8.1 g/dL - - 7.6  Total Bilirubin 0.3 - 1.2 mg/dL - - 0.2(L)  Alkaline Phos 38 - 126 U/L - - 65  AST 15 - 41 U/L - - 21  ALT 17 - 63 U/L - - 15(L)   A/P:  56 yr old male POD#2 from Colostomy takedown.   Pain: continue scheduled tylenol, toradol, flexeril and prn oxycodone and dilaudid as needed GI: continue JP drian, dry dressings BID, advance to full liquids Encourage ambulation

## 2016-12-26 NOTE — Anesthesia Postprocedure Evaluation (Signed)
Anesthesia Post Note  Patient: Clayton Butler  Procedure(s) Performed: Procedure(s) (LRB): COLOSTOMY TAKEDOWN and possible incisional hernia repair (N/A) HERNIA REPAIR INCISIONAL (N/A) CYSTOSCOPY WITH STENT PLACEMENT (Bilateral)  Patient location during evaluation: PACU Anesthesia Type: General Level of consciousness: awake and alert Pain management: pain level controlled Vital Signs Assessment: post-procedure vital signs reviewed and stable Respiratory status: spontaneous breathing, nonlabored ventilation, respiratory function stable and patient connected to nasal cannula oxygen Cardiovascular status: blood pressure returned to baseline and stable Postop Assessment: no signs of nausea or vomiting Anesthetic complications: no     Last Vitals:  Vitals:   12/26/16 0451 12/26/16 0754  BP: 119/69 109/66  Pulse: (!) 109 100  Resp: (!) 22 16  Temp: 36.9 C 36.9 C    Last Pain:  Vitals:   12/26/16 0754  TempSrc: Oral  PainSc:                  Molli Barrows

## 2016-12-27 NOTE — Progress Notes (Signed)
56 yr old male POD#3 from Colostomy takedown.  Patient doing well today. He has been passing flatus and having liquid BM x 2.  He tolerated full liquids well.  He has been up and moving and pain well controlled.   Vitals:   12/27/16 0450 12/27/16 0801  BP: 107/70 108/67  Pulse: 91 (!) 101  Resp: 19   Temp: 98 F (36.7 C) 97.9 F (36.6 C)   I/O last 3 completed shifts: In: 1882.5 [P.O.:120; I.V.:1762.5] Out: [id number] [Urine:1235; Drains:370] Total I/O In: 480 [P.O.:480] Out: 1190 [Urine:1110; Drains:80]   PE:  Gen: NAD Abd: soft, moderately distended, midline incision c/d/i, loosely closed with staples, serosanginous drainage on wound and JP drain  CBC Latest Ref Rng & Units 12/26/2016 12/25/2016 12/17/2016  WBC 3.8 - 10.6 K/uL 10.1 11.0(H) 8.5  Hemoglobin 13.0 - 18.0 g/dL 12.4(L) 14.1 15.7  Hematocrit 40.0 - 52.0 % 34.6(L) 38.7(L) 45.4  Platelets 150 - 440 K/uL 178 176 214   CMP Latest Ref Rng & Units 12/26/2016 12/25/2016 12/17/2016  Glucose 65 - 99 mg/dL 113(H) 149(H) 93  BUN 6 - 20 mg/dL 15 12 11   Creatinine 0.61 - 1.24 mg/dL 1.30(H) 1.21 0.96  Sodium 135 - 145 mmol/L 136 136 137  Potassium 3.5 - 5.1 mmol/L 3.4(L) 3.8 4.3  Chloride 101 - 111 mmol/L 102 105 104  CO2 22 - 32 mmol/L 29 27 25   Calcium 8.9 - 10.3 mg/dL 8.2(L) 8.0(L) 9.3  Total Protein 6.5 - 8.1 g/dL - - 7.6  Total Bilirubin 0.3 - 1.2 mg/dL - - 0.2(L)  Alkaline Phos 38 - 126 U/L - - 65  AST 15 - 41 U/L - - 21  ALT 17 - 63 U/L - - 15(L)   A/P:  56 yr old male POD#3 from Colostomy takedown.   Pain: continue scheduled tylenol, toradol, flexeril and prn oxycodone and dilaudid as needed GI: continue JP drain until decreased, may be able to remove tomorrow, dry dressings BID, advance to regular diet Encourage ambulation Likely d/c home tomorrow

## 2016-12-27 NOTE — Progress Notes (Signed)
Patient up to chair. Had medium bowel movement, ambulated around nurse station x1. Dressing changed. No complaints of pain. Continue to assess.

## 2016-12-28 MED ORDER — CYCLOBENZAPRINE HCL 10 MG PO TABS: 10.0000 mg | ORAL_TABLET | Freq: Three times a day (TID) | ORAL | 0 refills | Status: DC | PRN

## 2016-12-28 MED ORDER — IBUPROFEN 800 MG PO TABS: 800.0000 mg | ORAL_TABLET | Freq: Three times a day (TID) | ORAL | 0 refills | Status: DC | PRN

## 2016-12-28 MED ORDER — HYDROCODONE-ACETAMINOPHEN 5-325 MG PO TABS: 1.0000 | ORAL_TABLET | Freq: Four times a day (QID) | ORAL | 0 refills | Status: DC | PRN

## 2016-12-28 NOTE — Progress Notes (Signed)
Pt d/c to home today. JP to remain intact.  Pt received education on how to care for and empty drain.  Pt also given instruction on how to change dressing.  IV removed intact.  Rx's given to pt w/all questions and concerns addressed.  D/C paperwork reviewed and education provided with all questions and concerns addressed.  Pt family at bedside for home transport.

## 2016-12-28 NOTE — Discharge Summary (Signed)
Physician Discharge Summary  Patient ID: Clayton Butler MRN: [id number] DOB/AGE: 1961/03/15 56 y.o.  Admit date: 12/24/2016 Discharge date: 12/28/2016  Admission Diagnoses:  Diverticulitis with perforation, colostomy in place, incisional hernia  Discharge Diagnoses:  Active Problems:   Diverticulitis   Diverticulitis of large intestine with perforation and abscess without bleeding   Incisional hernia, without obstruction or gangrene   Discharged Condition: good  Hospital Course: 56 yr old who had Diverticulitis with perforation and colostomy placement in [place] 2017, he came in for colostomy reversal and incisional hernia repair on 1/17.  Patient overall doing well.  He is tolerating regular diet, up and moving around well, pain well controlled with PO meds and having BMs.  His JP drain is still putting out about 300c a day, he will go home with this.    Consults: Urology  Significant Diagnostic Studies: CT  Treatments: surgery: Colostomy reversal, incisional hernia repair   Discharge Exam: Blood pressure 113/71, pulse 93, temperature 97.5 F (36.4 C), temperature source Oral, resp. rate 17, height 6\' 2"  (1.88 m), weight 209 lb (94.8 kg), SpO2 97 %. General appearance: alert, cooperative and no distress GI: midline wound clean, minimal serous drainage, loosely closed with staples, colostomy site closed with staples, no erythema no drainage, JP drain with serosanginous drainage Extremities: extremities normal, atraumatic, no cyanosis or edema  Disposition: 01-Home or Self Care  Discharge Instructions    Call MD for:  persistant nausea and vomiting    Complete by:  As directed    Call MD for:  redness, tenderness, or signs of infection (pain, swelling, redness, odor or green/yellow discharge around incision site)    Complete by:  As directed    Call MD for:  severe uncontrolled pain    Complete by:  As directed    Call MD for:  temperature >100.4    Complete by:  As directed     Diet general    Complete by:  As directed    Discharge instructions    Complete by:  As directed    Empty, measure and recharge drain twice daily or as needed, please record in log and bring back to Dr. Gabriel Carina with you   Discharge wound care:    Complete by:  As directed    Dry dressings to abdomen twice daily   Driving Restrictions    Complete by:  As directed    No driving while on prescription pain medication   Increase activity slowly    Complete by:  As directed    Lifting restrictions    Complete by:  As directed    No lifting over 15lbs for 6 weeks   May shower / Bathe    Complete by:  As directed      Allergies as of 12/28/2016      Reactions   Morphine And Related Other (See Comments)   Syncope      Medication List    TAKE these medications   buPROPion 150 MG 12 hr tablet Commonly known as:  WELLBUTRIN SR Take 1 tablet (150 mg total) by mouth 2 (two) times daily. Take 1 tablet by mouth for first 3 days, then increase to twice daily. What changed:  when to take this  additional instructions   cyclobenzaprine 10 MG tablet Commonly known as:  FLEXERIL Take 1 tablet (10 mg total) by mouth 3 (three) times daily as needed for muscle spasms.   HYDROcodone-acetaminophen 5-325 MG tablet Commonly known as:  NORCO/VICODIN Take 1-2  tablets by mouth every 6 (six) hours as needed for moderate pain.   ibuprofen 800 MG tablet Commonly known as:  ADVIL,MOTRIN Take 1 tablet (800 mg total) by mouth every 8 (eight) hours as needed.   polyethylene glycol packet Commonly known as:  MIRALAX / GLYCOLAX Take 17 g by mouth every other day.      Follow-up Information    Hubbard Robinson, MD Follow up on 01/01/2017.   Specialty:  Surgery Why:  f/u Dr. Azalee Course 01/01/17 at 1:45pm  Contact information: [place] [address] [phone]           Signed: Hubbard Robinson 12/28/2016, 11:57 AM

## 2016-12-29 ENCOUNTER — Telehealth: Payer: Self-pay | Admitting: Surgery

## 2016-12-29 ENCOUNTER — Encounter: Payer: Self-pay | Admitting: Surgery

## 2016-12-29 ENCOUNTER — Ambulatory Visit (INDEPENDENT_AMBULATORY_CARE_PROVIDER_SITE_OTHER): Payer: BLUE CROSS/BLUE SHIELD | Admitting: Surgery

## 2016-12-29 VITALS — BP 160/90 | HR 118 | Temp 97.6°F | Ht 74.0 in | Wt 220.2 lb

## 2016-12-29 DIAGNOSIS — Z8719 Personal history of other diseases of the digestive system: Secondary | ICD-10-CM

## 2016-12-29 DIAGNOSIS — Z9889 Other specified postprocedural states: Secondary | ICD-10-CM

## 2016-12-29 DIAGNOSIS — IMO0002 Reserved for concepts with insufficient information to code with codable children: Secondary | ICD-10-CM

## 2016-12-29 NOTE — Telephone Encounter (Signed)
Spoke with patient at this time. He states that suture holding drain in place has pulled away from skin and is no longer holding drain. Drain is continuing to excrete fluid. He has emptied 40cc at 2230 yesterday and then 80cc at 0545 and 60cc at 0830 this am. No other concerns, states that he is doing well.  Spoke with Dr. Hampton Abbot. He would like to see patient in clinic to place new suture at drain site.  Patient placed on schedule.

## 2016-12-29 NOTE — Telephone Encounter (Signed)
Patient left a message for a nurse [employer] account planner) to call him. No reason was given.

## 2016-12-29 NOTE — Progress Notes (Signed)
12/29/2016  HPI: 56 year old male status post end colostomy takedown and incisional hernia repair with Dr. Azalee Course on 1/17. He was discharged from the hospital yesterday and called this morning because he thought the drain had pulled and had felt some drainage dripping on his foot. Denies any worsening pain, fevers, chills. Did notice some drainage from the midline incision this morning but about the same volume as with the prior dressing changes.   Vital signs: BP (!) 160/90   Pulse (!) 118   Temp 97.6 F (36.4 C) (Oral)   Ht 6\' 2"  (1.88 m)   Wt 99.9 kg (220 lb 3.2 oz)   BMI 28.27 kg/m    Physical Exam: Constitutional: No acute distress Abdomen: Soft, nondistended, properly tender to palpation. Midline incision with staples without any evidence of infection. The lower portion of the abdominal wound has some areas where the skin has opened in between the staples with some serous drainage from the wound. There is no purulence. The ostomy incision site is clean as well with no infection with mild serous fluid drainage.  JP drain in place with suture securing it to the skin, with serosanguinous fluid in bulb.  No evidence of evisceration.  Assessment/Plan: 56 yo male s/p colostomy takedown and incisional hernia repair  -Reassured patient that currently the drain is still in place and the suture is intact. Likely the patient had some drainage from the midline incision which looks like the skin has opened in between the staples. Otherwise the subcutaneous tissue looks approximated and there is no evidence of any evisceration. There is minimal serous drainage from the ostomy site and overall no evidence of infection or other complications. The JP still continues to drain a significant amount. -Patient will follow-up with Dr. Azalee Course later this week for further evaluation of the wound and also for possible removal of the JP drain. The patient is aware that because the skin between the staples has opened  that that portion of the wound may open up more and if that happens to give [place] a call so that we can evaluate him further. -Recommended the patient wear an abdominal binder to decrease some of the tension on the incision itself. Patient has one at home already from his previous surgery.   Melvyn Neth, [place]

## 2016-12-29 NOTE — Patient Instructions (Signed)
Please keep your follow up appointment on Thursday. Please continue to change dressing twice daily. Please call our office if you have any questions or concerns.

## 2017-01-01 ENCOUNTER — Ambulatory Visit (INDEPENDENT_AMBULATORY_CARE_PROVIDER_SITE_OTHER): Payer: BLUE CROSS/BLUE SHIELD | Admitting: General Surgery

## 2017-01-01 ENCOUNTER — Encounter: Payer: Self-pay | Admitting: Surgery

## 2017-01-01 VITALS — BP 144/84 | HR 101 | Temp 97.4°F | Ht 74.0 in | Wt 213.0 lb

## 2017-01-01 DIAGNOSIS — Z4889 Encounter for other specified surgical aftercare: Secondary | ICD-10-CM

## 2017-01-01 NOTE — Patient Instructions (Signed)
We have removed your drain today. Please keep a dressing over the drain site until closes completely. Please see your follow up appointment listed below.

## 2017-01-01 NOTE — Progress Notes (Signed)
Outpatient Surgical Follow Up  01/01/2017  Clayton Butler is an 56 y.o. male.   Chief Complaint  Patient presents with  . Routine Post Op    Open partial colectomy,reversal Colostomy repai of ventral hernia -12/24/16-Dr.Loflin    HPI: 56 year old male returns to clinic 1 week s/p colostomy reversal. Doing well. Tolerating a diet and keeping himself hydrated. His is having bowel function. Has been having some drainage from his midline but it is clear and decreasing. He is ready to have his drain removed. Denies any fevers, chills, nausea, vomiting, diarrhea, constipation.  Past Medical History:  Diagnosis Date  . Cervical radiculitis 10/20/2014  . Diverticulitis of colon with perforation 05/20/2016  . Wears dentures    full upper    Past Surgical History:  Procedure Laterality Date  . COLECTOMY WITH COLOSTOMY CREATION/HARTMANN PROCEDURE N/A 05/22/2016   Procedure: COLECTOMY WITH COLOSTOMY CREATION/HARTMANN PROCEDURE;  Surgeon: Jules Husbands, MD;  Location: [facility];  Service: General;  Laterality: N/A;  . COLONOSCOPY WITH PROPOFOL N/A 10/09/2016   Procedure: COLONOSCOPY WITH PROPOFOL;  Surgeon: Lucilla Lame, MD;  Location: [place];  Service: Endoscopy;  Laterality: N/A;  . COLOSTOMY TAKEDOWN N/A 12/24/2016   Procedure: COLOSTOMY TAKEDOWN and possible incisional hernia repair;  Surgeon: Hubbard Robinson, MD;  Location: [facility];  Service: General;  Laterality: N/A;  . CYSTOSCOPY WITH STENT PLACEMENT Bilateral 12/24/2016   Procedure: CYSTOSCOPY WITH STENT PLACEMENT;  Surgeon: Festus Aloe, MD;  Location: [facility];  Service: Urology;  Laterality: Bilateral;  . HAND SURGERY Left 1987  . INCISIONAL HERNIA REPAIR N/A 12/24/2016   Procedure: HERNIA REPAIR INCISIONAL;  Surgeon: Hubbard Robinson, MD;  Location: [facility];  Service: General;  Laterality: N/A;  . LAPAROTOMY N/A 05/22/2016   Procedure: EXPLORATORY LAPAROTOMY;  Surgeon: Jules Husbands, MD;  Location: [facility];   Service: General;  Laterality: N/A;  . LAPAROTOMY N/A 05/27/2016   Procedure: EXPLORATORY LAPAROTOMY;  Surgeon: Hubbard Robinson, MD;  Location: [facility];  Service: General;  Laterality: N/A;  . POLYPECTOMY N/A 10/09/2016   Procedure: POLYPECTOMY, 8 colon polyps;  Surgeon: Lucilla Lame, MD;  Location: [place];  Service: Endoscopy;  Laterality: N/A;    Family History  Problem Relation Age of Onset  . Stroke Mother   . Stroke Father     Social History:  reports that he has been smoking Cigarettes.  He has smoked for the past 30.00 years. He has never used smokeless tobacco. He reports that he drinks about 6.0 oz of alcohol per week . He reports that he does not use drugs.  Allergies:  Allergies  Allergen Reactions  . Morphine And Related Other (See Comments)    Syncope    Medications reviewed.    ROS A multi-point ROS completed and documented in the HPI   BP (!) 144/84   Pulse (!) 101   Temp 97.4 F (36.3 C) (Oral)   Ht 6\' 2"  (1.88 m)   Wt 96.6 kg (213 lb)   BMI 27.35 kg/m   Physical Exam Gen: NAD Resp: CTA CV: RRR Abd: Soft, appropriately TTP at the incision site. JP with serous fluid. Midline and ostomy site with staples in place. Lower 1/3 of incision with visible subcutaneous tissues. No evidence of purulence. JP with less than 100 ml in the past 24 hours.    No results found for this or any previous visit (from the past 48 hour(s)). No results found.  Assessment/Plan:  1. Aftercare  following surgery Patient doing well. Drain removed without issues. He will follow up in 1 week for a wound check and likely to remove staples. Instructed to return to clinic if there are any signs of infections.     [name], MD FACS General Surgeon  01/01/2017,2:26 PM

## 2017-01-06 ENCOUNTER — Encounter: Payer: Self-pay | Admitting: Surgery

## 2017-01-07 ENCOUNTER — Ambulatory Visit (INDEPENDENT_AMBULATORY_CARE_PROVIDER_SITE_OTHER): Payer: BLUE CROSS/BLUE SHIELD | Admitting: General Surgery

## 2017-01-07 ENCOUNTER — Encounter: Payer: Self-pay | Admitting: General Surgery

## 2017-01-07 VITALS — BP 158/82 | HR 118 | Temp 97.7°F | Ht 74.0 in | Wt 204.6 lb

## 2017-01-07 DIAGNOSIS — Z4889 Encounter for other specified surgical aftercare: Secondary | ICD-10-CM

## 2017-01-07 MED ORDER — SULFAMETHOXAZOLE-TRIMETHOPRIM 800-160 MG PO TABS: 1.0000 | ORAL_TABLET | Freq: Two times a day (BID) | ORAL | 0 refills | Status: DC

## 2017-01-07 NOTE — Patient Instructions (Signed)
Please pick up your medicine at the pharmacy. Please call our office if you notice increasing redness, pus drainage, pain or fever. Please see your follow up appointment listed below.

## 2017-01-08 NOTE — Progress Notes (Signed)
Outpatient Surgical Follow Up  01/08/2017  Clayton Butler is an 56 y.o. male.   Chief Complaint  Patient presents with  . Routine Post Op    Open Partial Colectomy Reversal Colostomy Repair of Ventral Hernia-12/24/16-Dr.Loflin    HPI: 56 year old male returns to clinic for follow-up 2 weeks status post colostomy reversal. He is noted that the lower aspect of his incision has completely split open but has had minimal drainage. He is not requiring any pain medications and is having appropriate diet and bowel function. Patient does report that he's noticed a new odor to the drainage from his midline and there is a little bit of redness around the staples that was not there previously. He denies any fevers, chills, nausea, vomiting.  Past Medical History:  Diagnosis Date  . Cervical radiculitis 10/20/2014  . Diverticulitis of colon with perforation 05/20/2016  . Wears dentures    full upper    Past Surgical History:  Procedure Laterality Date  . COLECTOMY WITH COLOSTOMY CREATION/HARTMANN PROCEDURE N/A 05/22/2016   Procedure: COLECTOMY WITH COLOSTOMY CREATION/HARTMANN PROCEDURE;  Surgeon: Jules Husbands, MD;  Location: [facility];  Service: General;  Laterality: N/A;  . COLONOSCOPY WITH PROPOFOL N/A 10/09/2016   Procedure: COLONOSCOPY WITH PROPOFOL;  Surgeon: Lucilla Lame, MD;  Location: [place];  Service: Endoscopy;  Laterality: N/A;  . COLOSTOMY TAKEDOWN N/A 12/24/2016   Procedure: COLOSTOMY TAKEDOWN and possible incisional hernia repair;  Surgeon: Hubbard Robinson, MD;  Location: [facility];  Service: General;  Laterality: N/A;  . CYSTOSCOPY WITH STENT PLACEMENT Bilateral 12/24/2016   Procedure: CYSTOSCOPY WITH STENT PLACEMENT;  Surgeon: Festus Aloe, MD;  Location: [facility];  Service: Urology;  Laterality: Bilateral;  . HAND SURGERY Left 1987  . INCISIONAL HERNIA REPAIR N/A 12/24/2016   Procedure: HERNIA REPAIR INCISIONAL;  Surgeon: Hubbard Robinson, MD;  Location: [facility];   Service: General;  Laterality: N/A;  . LAPAROTOMY N/A 05/22/2016   Procedure: EXPLORATORY LAPAROTOMY;  Surgeon: Jules Husbands, MD;  Location: [facility];  Service: General;  Laterality: N/A;  . LAPAROTOMY N/A 05/27/2016   Procedure: EXPLORATORY LAPAROTOMY;  Surgeon: Hubbard Robinson, MD;  Location: [facility];  Service: General;  Laterality: N/A;  . POLYPECTOMY N/A 10/09/2016   Procedure: POLYPECTOMY, 8 colon polyps;  Surgeon: Lucilla Lame, MD;  Location: [place];  Service: Endoscopy;  Laterality: N/A;    Family History  Problem Relation Age of Onset  . Stroke Mother   . Stroke Father     Social History:  reports that he has been smoking Cigarettes.  He has smoked for the past 30.00 years. He has never used smokeless tobacco. He reports that he drinks about 6.0 oz of alcohol per week . He reports that he does not use drugs.  Allergies:  Allergies  Allergen Reactions  . Morphine And Related Other (See Comments)    Syncope    Medications reviewed.    ROS A multipoint review of systems was completed, all pertinent positives and negatives are documented within the history of present illness and remainder are negative.   BP (!) 158/82   Pulse (!) 118   Temp 97.7 F (36.5 C) (Oral)   Ht 6\' 2"  (1.88 m)   Wt 92.8 kg (204 lb 9.6 oz)   BMI 26.27 kg/m   Physical Exam Gen.: No acute distress Chest: Clear to auscultation Heart: Regular rate and rhythm Abdomen: Soft, appropriately tender at the midline, and nondistended. Staples in place with some evidence  of spreading erythema in the most cephalad aspect and in the periumbilical region going towards the left lower quadrant previous ostomy site. No evidence of purulent drainage.    No results found for this or any previous visit (from the past 48 hour(s)). No results found.  Assessment/Plan:  1. Aftercare following surgery 56 year old male status post ostomy reversal. New erythema and foul smelling drainage. Staples  removed today without difficulty and patient will be started on antibiotics. Given the new erythema and the change in odor patient will follow-up in clinic next week for additional wound check. Understands that should he worsen that he is to return to clinic sooner rather than later.     [name], MD FACS General Surgeon  01/08/2017,8:54 AM

## 2017-01-15 ENCOUNTER — Ambulatory Visit (INDEPENDENT_AMBULATORY_CARE_PROVIDER_SITE_OTHER): Payer: BLUE CROSS/BLUE SHIELD | Admitting: Surgery

## 2017-01-15 ENCOUNTER — Encounter: Payer: Self-pay | Admitting: Surgery

## 2017-01-15 VITALS — BP 145/84 | HR 99 | Temp 98.4°F | Ht 74.0 in | Wt 202.4 lb

## 2017-01-15 DIAGNOSIS — Z4889 Encounter for other specified surgical aftercare: Secondary | ICD-10-CM

## 2017-01-15 MED ORDER — CIPROFLOXACIN HCL 500 MG PO TABS: 500.0000 mg | ORAL_TABLET | Freq: Two times a day (BID) | ORAL | 0 refills | Status: AC

## 2017-01-15 NOTE — Progress Notes (Signed)
Outpatient postop visit  01/15/2017  Clayton Butler is an 56 y.o. male.    Procedure: Colostomy closure  CC: Redness of abdomen  HPI: This patient had a colostomy closure and a ventral hernia repair. Dr. Adonis Huguenin and seen the patient last week where he noticed foul foul-smelling fluid and erythema from the wound and he was placed on antibiotics. His erythema has improved however. He has been on Bactrim for a few days. He's had no further drainage  Medications reviewed.    Physical Exam:  There were no vitals taken for this visit.    PE: No drainage from any of his wounds there is some eschar present but very minimal. There is erythema both cephalad and caudad but no expressible purulence and minimal if any tenderness. (The patient states that the erythema especially at the cephalad portion of the wound is much improved)    Assessment/Plan:  Status post colostomy closure. He has been on antibiotics for a few days for a wound infection. The abdominal wall as it is exhibiting some erythema but no drainage and he's been on Bactrim for a few days I would change him to Cipro at this point and reexamine him next week I see no expressible or drainable areas on his abdomen but this requires further evaluation until it completely resolves.  Florene Glen, MD, FACS

## 2017-01-15 NOTE — Patient Instructions (Signed)
Please stop your Bactrim (your current antibiotic) and begin your Cipro (your new antibiotic).   Follow-up early next week as scheduled.  Please review the following symptoms of infection. If you develop any of these below, please call our office with any questions or concerns.   What is a Surgical Site Infection (SSI)?  A surgical site infection is an infection that occurs after surgery in the part of the body where the surgery took place. Most patients who have surgery do not develop an infection. However, infections develop in about 1 to 3 out of every 100 patients who have surgery. Some of the common symptoms of a surgical site infection are:  Redness and pain around the area where you had surgery  Drainage of cloudy fluid from your surgical wound  Fever

## 2017-01-19 ENCOUNTER — Ambulatory Visit (INDEPENDENT_AMBULATORY_CARE_PROVIDER_SITE_OTHER): Payer: BLUE CROSS/BLUE SHIELD | Admitting: General Surgery

## 2017-01-19 ENCOUNTER — Encounter: Payer: Self-pay | Admitting: General Surgery

## 2017-01-19 VITALS — BP 144/84 | HR 101 | Temp 97.6°F | Ht 74.0 in | Wt 202.2 lb

## 2017-01-19 DIAGNOSIS — Z4889 Encounter for other specified surgical aftercare: Secondary | ICD-10-CM

## 2017-01-19 NOTE — Patient Instructions (Signed)
We will have you come back to clinic in 3 weeks. Please call next Monday if you are not feeling any better, call our office and we will work you in to be seen with Dr. Burt Knack.  You may return to work next week, if you need a note for work please let us know.  Please call with any questions or concerns.

## 2017-01-19 NOTE — Progress Notes (Signed)
Outpatient Surgical Follow Up  01/19/2017  Clayton Butler is an 56 y.o. male.   Chief Complaint  Patient presents with  . Routine Post Op    Open Colostomy Takedown with Bowel Resection and Ventral Hernia Repair (12/24/16)- Dr. Azalee Course    HPI: 56 year old male returns to clinic for follow-up from recent ostomy takedown. Was started on antibiotics recently for wound infection. Has been improving since that time. Has scabs to his midline and prior ostomy sites with a minimal erythema to the left lower quadrant. This is much improved per the patient. He denies any fevers, chills, nausea, vomiting, chest pain, shortness of breath, diarrhea, constipation.  Past Medical History:  Diagnosis Date  . Cervical radiculitis 10/20/2014  . Diverticulitis of colon with perforation 05/20/2016  . Wears dentures    full upper    Past Surgical History:  Procedure Laterality Date  . COLECTOMY WITH COLOSTOMY CREATION/HARTMANN PROCEDURE N/A 05/22/2016   Procedure: COLECTOMY WITH COLOSTOMY CREATION/HARTMANN PROCEDURE;  Surgeon: Jules Husbands, MD;  Location: [facility];  Service: General;  Laterality: N/A;  . COLONOSCOPY WITH PROPOFOL N/A 10/09/2016   Procedure: COLONOSCOPY WITH PROPOFOL;  Surgeon: Lucilla Lame, MD;  Location: [place];  Service: Endoscopy;  Laterality: N/A;  . COLOSTOMY TAKEDOWN N/A 12/24/2016   Procedure: COLOSTOMY TAKEDOWN and possible incisional hernia repair;  Surgeon: Hubbard Robinson, MD;  Location: [facility];  Service: General;  Laterality: N/A;  . CYSTOSCOPY WITH STENT PLACEMENT Bilateral 12/24/2016   Procedure: CYSTOSCOPY WITH STENT PLACEMENT;  Surgeon: Festus Aloe, MD;  Location: [facility];  Service: Urology;  Laterality: Bilateral;  . HAND SURGERY Left 1987  . INCISIONAL HERNIA REPAIR N/A 12/24/2016   Procedure: HERNIA REPAIR INCISIONAL;  Surgeon: Hubbard Robinson, MD;  Location: [facility];  Service: General;  Laterality: N/A;  . LAPAROTOMY N/A 05/22/2016   Procedure:  EXPLORATORY LAPAROTOMY;  Surgeon: Jules Husbands, MD;  Location: [facility];  Service: General;  Laterality: N/A;  . LAPAROTOMY N/A 05/27/2016   Procedure: EXPLORATORY LAPAROTOMY;  Surgeon: Hubbard Robinson, MD;  Location: [facility];  Service: General;  Laterality: N/A;  . POLYPECTOMY N/A 10/09/2016   Procedure: POLYPECTOMY, 8 colon polyps;  Surgeon: Lucilla Lame, MD;  Location: [place];  Service: Endoscopy;  Laterality: N/A;    Family History  Problem Relation Age of Onset  . Stroke Mother   . Stroke Father     Social History:  reports that he has been smoking Cigarettes.  He has smoked for the past 30.00 years. He has never used smokeless tobacco. He reports that he drinks alcohol. He reports that he does not use drugs.  Allergies:  Allergies  Allergen Reactions  . Morphine And Related Other (See Comments)    Syncope    Medications reviewed.    ROS A multipoint review of systems was completed. All pertinent positives and negatives are documented in the history of present illness and remainder are negative.   BP (!) 144/84   Pulse (!) 101   Temp 97.6 F (36.4 C) (Oral)   Ht 6\' 2"  (1.88 m)   Wt 91.7 kg (202 lb 3.2 oz)   BMI 25.96 kg/m   Physical Exam Gen.: No acute distress Chest: Clear to auscultation Heart: Regular rhythm Abdomen: Soft, nontender, nondistended. The superior aspect the midline as well as proximally. The distal aspect as well as the prior ostomy site has numerous areas of scab present. There is no evidence of active drainage. There is some blanching erythema  caudad to the prior ostomy site.    No results found for this or any previous visit (from the past 48 hour(s)). No results found.  Assessment/Plan:  1. Aftercare following surgery 56 year old male status post ostomy takedown. Doing well. Counseled him to complete his course of antibiotics as the erythema is much improved. Discussed that should the erythema continue into next week that  he is to call and be seen in clinic next week for a additional wound check. Otherwise he will follow-up in clinic in 3 weeks prior to being discharged to return to normal activities. All questions answered to the patient's satisfaction.     [name], MD [facility] General Surgeon  01/19/2017,2:11 PM

## 2017-02-11 ENCOUNTER — Ambulatory Visit (INDEPENDENT_AMBULATORY_CARE_PROVIDER_SITE_OTHER): Payer: BLUE CROSS/BLUE SHIELD | Admitting: General Surgery

## 2017-02-11 ENCOUNTER — Encounter: Payer: Self-pay | Admitting: General Surgery

## 2017-02-11 VITALS — BP 151/87 | HR 92 | Temp 97.5°F | Ht 74.0 in | Wt 202.4 lb

## 2017-02-11 DIAGNOSIS — Z4889 Encounter for other specified surgical aftercare: Secondary | ICD-10-CM

## 2017-02-11 NOTE — Patient Instructions (Signed)
Please call our office if you have questions or concerns.   

## 2017-02-11 NOTE — Progress Notes (Signed)
Outpatient Surgical Follow Up  02/11/2017  Clayton Butler is an 56 y.o. male.   Chief Complaint  Patient presents with  . Routine Post Op    Open Colostomy Takedown w/ Bowel Resection and Ventral Hernia Repair-12/24/16-Dr.Loflin    HPI: 56 year old male returns to clinic for a postop visit status post ostomy takedown and ventral hernia repair. Patient reports doing very well. The previous areas of erythema and infection completely resolved after his last visit. He states he is eating well, having normal bowel function, without other complaints. He denies any fevers, chills, nausea, vomiting, chest pain, short of breath, diarrhea, constipation. He's been very happy with his surgical experience is far.  Past Medical History:  Diagnosis Date  . Cervical radiculitis 10/20/2014  . Diverticulitis of colon with perforation 05/20/2016  . Wears dentures    full upper    Past Surgical History:  Procedure Laterality Date  . COLECTOMY WITH COLOSTOMY CREATION/HARTMANN PROCEDURE N/A 05/22/2016   Procedure: COLECTOMY WITH COLOSTOMY CREATION/HARTMANN PROCEDURE;  Surgeon: Jules Husbands, MD;  Location: [facility];  Service: General;  Laterality: N/A;  . COLONOSCOPY WITH PROPOFOL N/A 10/09/2016   Procedure: COLONOSCOPY WITH PROPOFOL;  Surgeon: Lucilla Lame, MD;  Location: [place];  Service: Endoscopy;  Laterality: N/A;  . COLOSTOMY TAKEDOWN N/A 12/24/2016   Procedure: COLOSTOMY TAKEDOWN and possible incisional hernia repair;  Surgeon: Hubbard Robinson, MD;  Location: [facility];  Service: General;  Laterality: N/A;  . CYSTOSCOPY WITH STENT PLACEMENT Bilateral 12/24/2016   Procedure: CYSTOSCOPY WITH STENT PLACEMENT;  Surgeon: Festus Aloe, MD;  Location: [facility];  Service: Urology;  Laterality: Bilateral;  . HAND SURGERY Left 1987  . INCISIONAL HERNIA REPAIR N/A 12/24/2016   Procedure: HERNIA REPAIR INCISIONAL;  Surgeon: Hubbard Robinson, MD;  Location: [facility];  Service: General;   Laterality: N/A;  . LAPAROTOMY N/A 05/22/2016   Procedure: EXPLORATORY LAPAROTOMY;  Surgeon: Jules Husbands, MD;  Location: [facility];  Service: General;  Laterality: N/A;  . LAPAROTOMY N/A 05/27/2016   Procedure: EXPLORATORY LAPAROTOMY;  Surgeon: Hubbard Robinson, MD;  Location: [facility];  Service: General;  Laterality: N/A;  . POLYPECTOMY N/A 10/09/2016   Procedure: POLYPECTOMY, 8 colon polyps;  Surgeon: Lucilla Lame, MD;  Location: [place];  Service: Endoscopy;  Laterality: N/A;    Family History  Problem Relation Age of Onset  . Stroke Mother   . Stroke Father     Social History:  reports that he has been smoking Cigarettes.  He has smoked for the past 30.00 years. He has never used smokeless tobacco. He reports that he drinks alcohol. He reports that he does not use drugs.  Allergies:  Allergies  Allergen Reactions  . Morphine And Related Other (See Comments)    Syncope    Medications reviewed.    ROS A multipoint review of systems was completed. All pertinent positives and negatives are documented within the history of present illness and remainder are negative   BP (!) 151/87   Pulse 92   Temp 97.5 F (36.4 C) (Oral)   Ht 6\' 2"  (1.88 m)   Wt 91.8 kg (202 lb 6.4 oz)   BMI 25.99 kg/m   Physical Exam Gen.: No acute distress Chest: Clear to auscultation Heart: Regular rhythm Abdomen: Soft, nontender, nondistended. Well-healed midline and prior ostomy sites without any evidence of erythema or drainage. No obvious ventral hernias on exam.    No results found for this or any previous visit (from the  past 48 hour(s)). No results found.  Assessment/Plan:  1. Aftercare following surgery 56 year old male status post colostomy reversal with ventral hernia repair. Doing very well. Discussed signs and symptoms of infection or hernia formation and to report to clinic immediately should they occur. Otherwise released him to resume normal activities with standard  postoperative precautions. He understands that any pain mean stop. He will follow up on an as-needed basis.     [name], MD FACS General Surgeon  02/11/2017,3:14 PM

## 2017-02-12 ENCOUNTER — Encounter: Payer: Self-pay | Admitting: General Surgery

## 2017-08-08 IMAGING — CT CT ABD-PELV W/ CM
2 of 5 series · 14 of 46 positions shown, 16 images · IV contrast (iopamidol)
Comparison: None.

CLINICAL DATA: Lower abdominal pain, diarrhea for 2 days, elevated
WBC

EXAM:
CT ABDOMEN AND PELVIS WITH CONTRAST
TECHNIQUE: Multidetector CT imaging of the abdomen and pelvis was performed
using the standard protocol following bolus administration of
intravenous contrast.
CONTRAST:  100mL [id number] IOPAMIDOL (IS2HHG-CJJ) INJECTION 61%

[Series 2: routine abd pel with · axial · 0.74mm/px · z∈[-525,-95]mm · 11 of 98 slices shown, 13 images]
[im 6/98  soft-tissue]
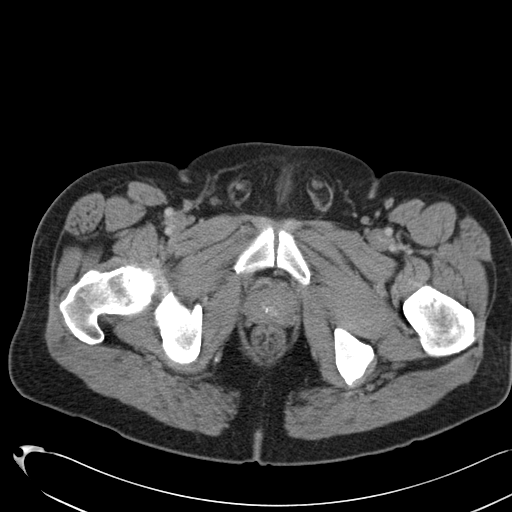
[im 6/98  bone]
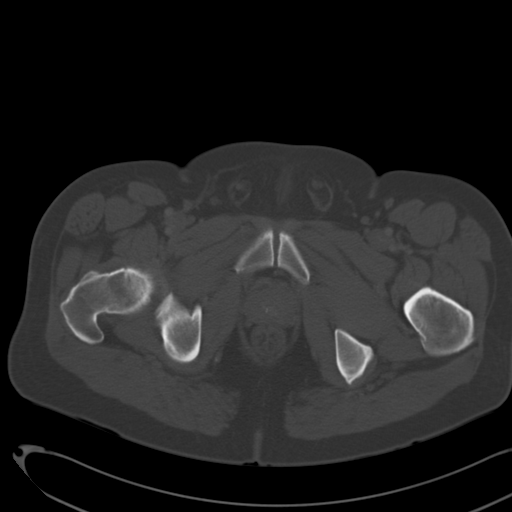
[im 16/98  soft-tissue]
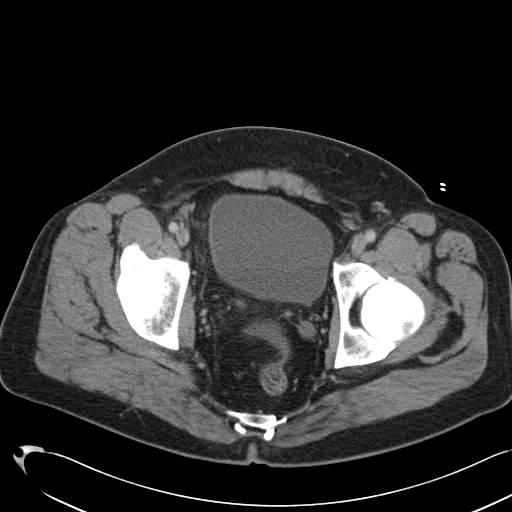
[im 26/98  soft-tissue]
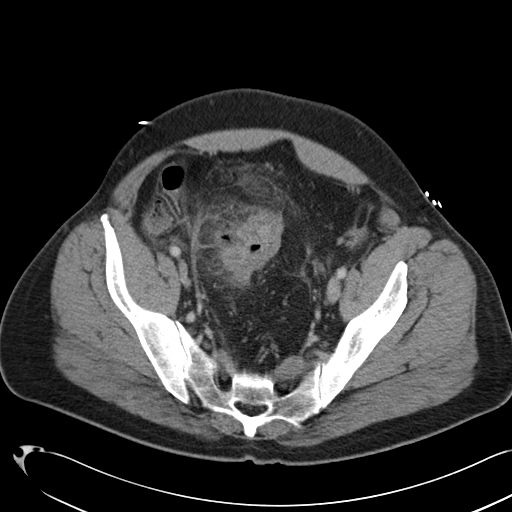
[im 31/98  soft-tissue]
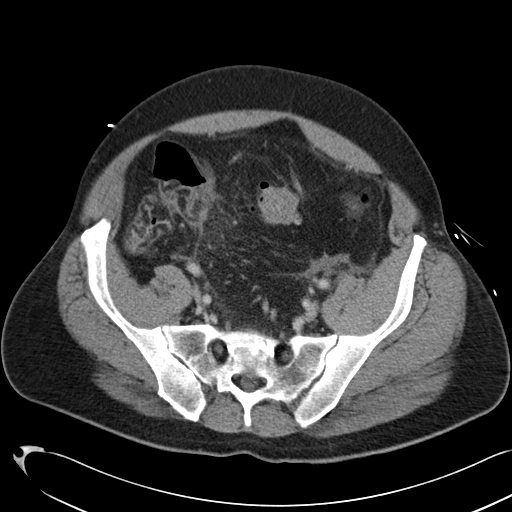
[im 41/98  soft-tissue]
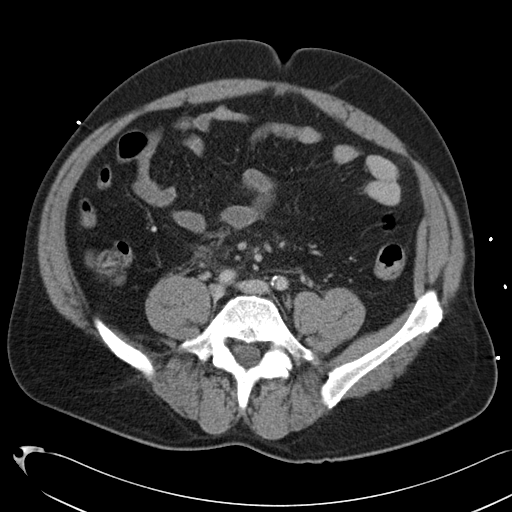
[im 52/98  soft-tissue]
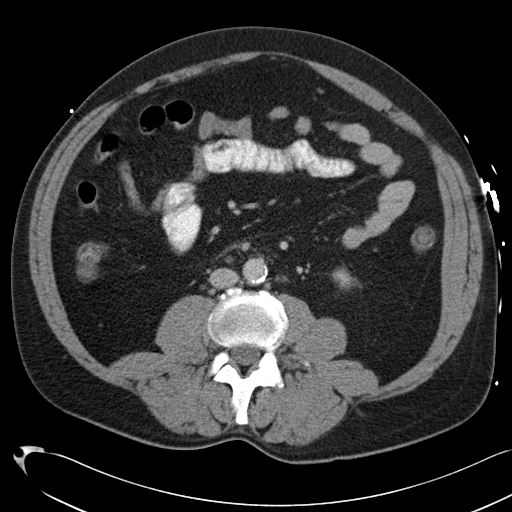
[im 57/98  soft-tissue]
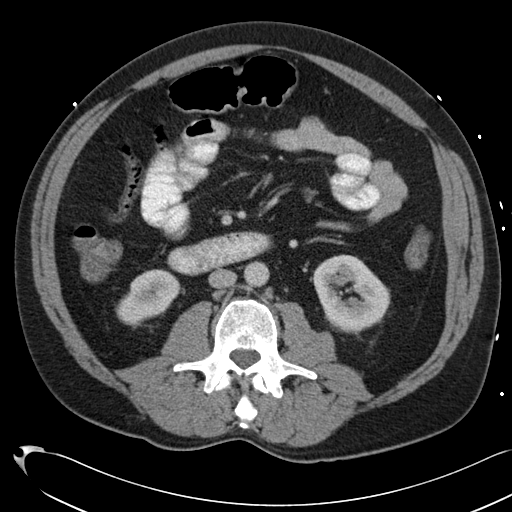
[im 67/98  soft-tissue]
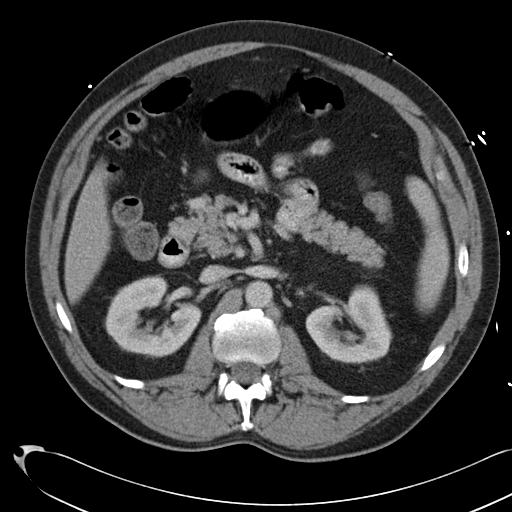
[im 72/98  soft-tissue]
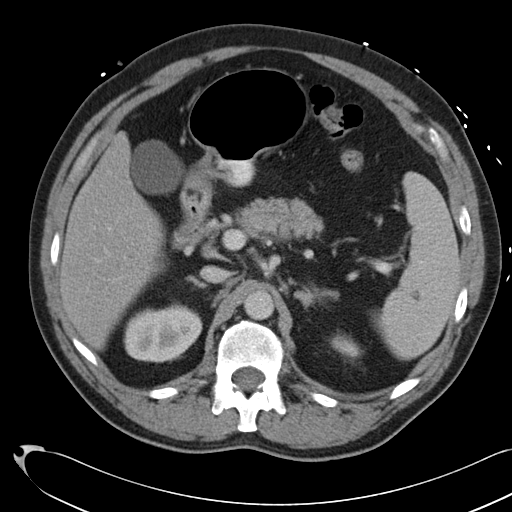
[im 72/98  bone]
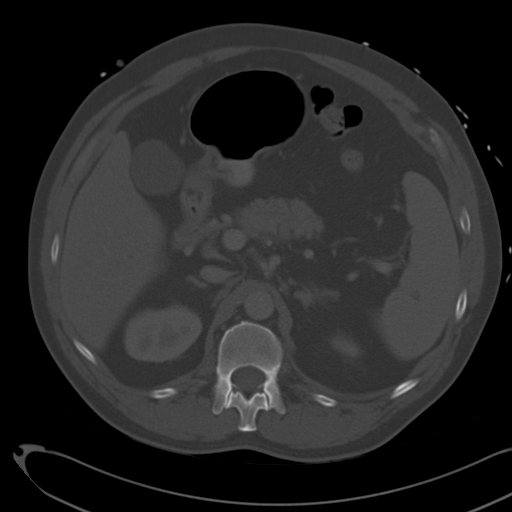
[im 82/98  soft-tissue]
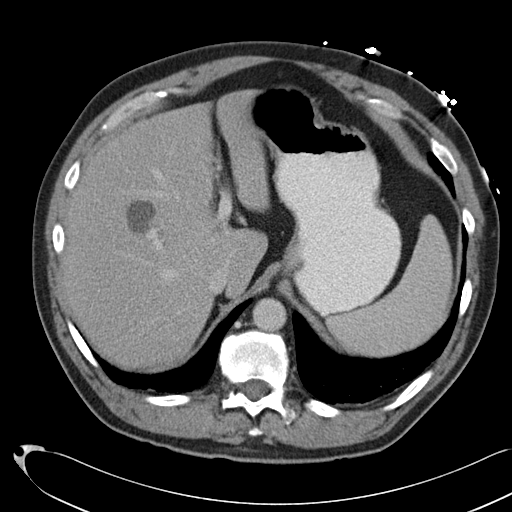
[im 92/98  soft-tissue]
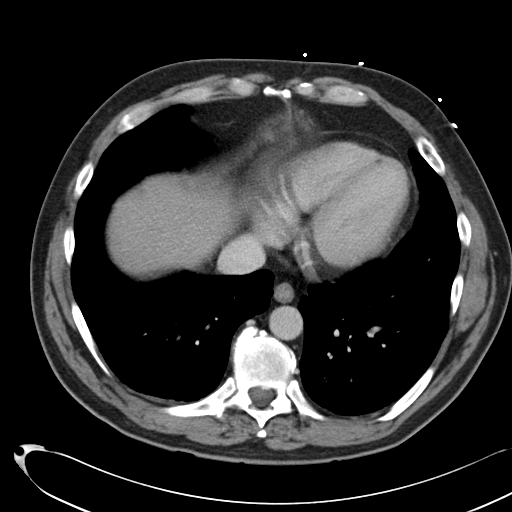

[Series 5: cor routine abd pel with · coronal · 0.72mm/px · 3 of 161 slices shown]
[im 54/161  soft-tissue]
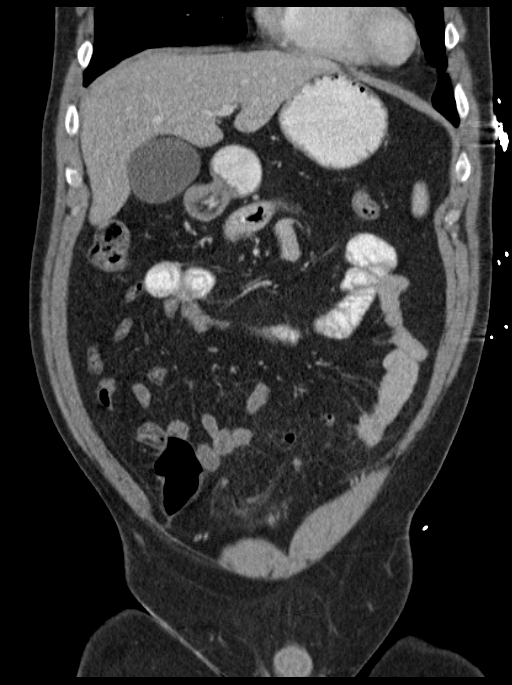
[im 72/161  soft-tissue]
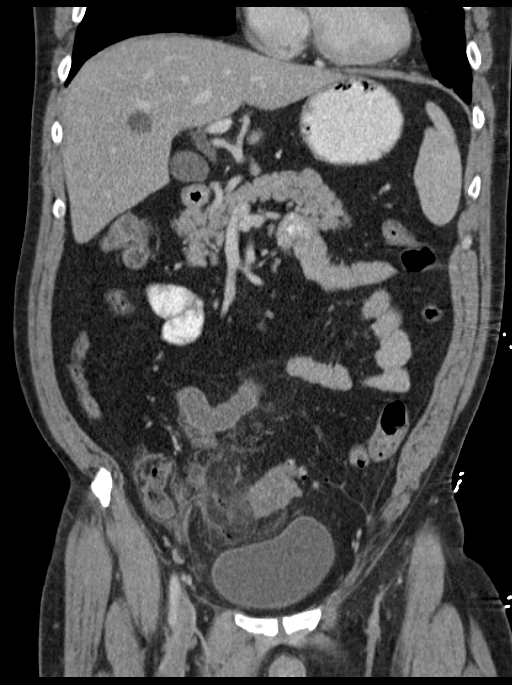
[im 89/161  soft-tissue]
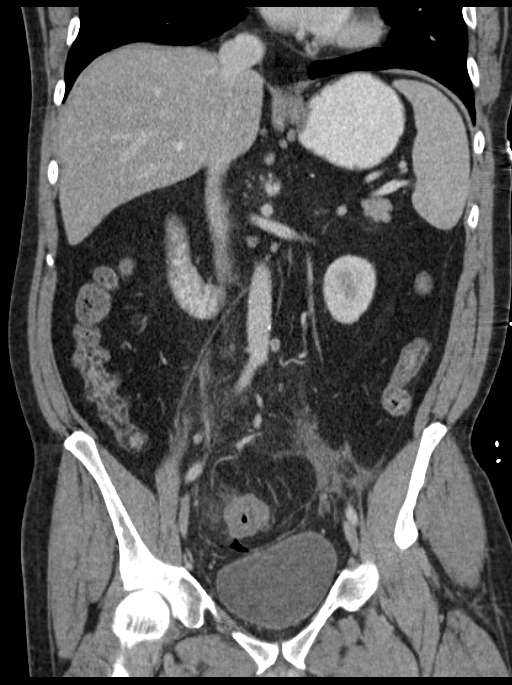

[14 of 46 positions shown; findings below may reference images not displayed]

FINDINGS: Lower chest:  The lung bases are unremarkable.

Hepatobiliary: At least 3 hepatic cysts are noted the largest in
right hepatic lobe centrally measures 2.7 cm. No intrahepatic
biliary ductal dilatation. No calcified gallstones are noted within
gallbladder.

Pancreas: Enhanced pancreas is unremarkable.

Spleen: Enhanced spleen is unremarkable.

Adrenals/Urinary Tract: There is a low-density nodule probable
adenoma right adrenal gland measures 2 cm. Minimal thickening of
left adrenal gland without evidence of mass. Kidneys are symmetrical
in size and enhancement. No hydronephrosis or hydroureter.

Delayed renal images shows bilateral renal symmetrical excretion.
The urinary bladder is unremarkable.

Stomach/Bowel: There is no gastric outlet obstruction. No small
bowel obstruction. No pericecal inflammation. The cecum is empty
collapsed. The appendix is not identified.

Minimal stranding of lower retroperitoneal fat.

Scattered diverticula are noted sigmoid colon. Axial image 74 there
is segmental thickening of mid sigmoid colon wall. There is
significant stranding of surrounding fat. Axial image 70 there is
small extraluminal mesenteric air in right posterior pelvis. Tiny
foci of extraluminal air noted just lateral to mid sigmoid colon
axial image 74. Axial image 79 there is air collection just inferior
to the sigmoid colon measures at least 3.3 cm. Findings are
consistent with perforated colitis or diverticulitis. There is no
evidence of pericolonic abscess. The sigmoid colon is empty
partially collapsed.

Vascular/Lymphatic: No aortic aneurysm. Mild atherosclerotic
calcifications of distal abdominal aorta and iliac arteries. Small
nonspecific retroperitoneal lymph nodes are noted probable reactive
the largest in axial image 50 measures 7 mm.

Reproductive: Prostate gland and seminal vesicles are unremarkable.
Prostate gland calcifications are noted. Small nonspecific bilateral
inguinal lymph nodes are noted. No adenopathy.

Other: No abdominal or pelvic ascites.

Musculoskeletal: No destructive bony lesions are noted. Sagittal
images of the spine shows mild degenerative changes lower thoracic
and lumbar spine. No destructive bony lesions are noted within
pelvis. Probable bone island left greater femoral trochanter
measures 7 mm.
IMPRESSION: 1. There is segmental thickening of mid sigmoid colon wall axial
image 74 and 75. This segment measures at least 7.5 cm in length.
Colonic diverticula are noted at this level. Multiple foci of
extraluminal air are noted. There is a air collection just inferior
to sigmoid colon at this level measures 3.3 cm. Findings are
consistent with perforated colitis or diverticulitis. No evidence of
pericolonic abscess or mesenteric fluid collection.
2. No pericecal inflammation.  Appendix is not identified.
3. No small bowel obstruction.
4. No hydronephrosis or hydroureter.
5. Scattered hepatic cysts are noted the largest measures 2.7 cm
6. Mild degenerative changes thoracolumbar spine.
These results were called by telephone at the time of interpretation
on 05/20/2016 at [DATE] to Dr. YUNADA MENGKO , who verbally
acknowledged these results.

## 2024-06-16 DIAGNOSIS — H25813 Combined forms of age-related cataract, bilateral: Secondary | ICD-10-CM | POA: Diagnosis not present

## 2024-10-03 DIAGNOSIS — H6123 Impacted cerumen, bilateral: Secondary | ICD-10-CM | POA: Diagnosis not present

## 2024-10-03 DIAGNOSIS — H900 Conductive hearing loss, bilateral: Secondary | ICD-10-CM | POA: Diagnosis not present
